# Patient Record
Sex: Female | Born: 1935 | Race: White | Hispanic: No | Smoking: Former smoker
Health system: Southern US, Community
[De-identification: ages and names within clinical notes are randomized; demographics above are authoritative.]

## PROBLEM LIST (undated history)

## (undated) DIAGNOSIS — I6529 Occlusion and stenosis of unspecified carotid artery: Secondary | ICD-10-CM

## (undated) DIAGNOSIS — I255 Ischemic cardiomyopathy: Secondary | ICD-10-CM

## (undated) DIAGNOSIS — I509 Heart failure, unspecified: Secondary | ICD-10-CM

## (undated) DIAGNOSIS — I701 Atherosclerosis of renal artery: Secondary | ICD-10-CM

## (undated) DIAGNOSIS — IMO0001 Reserved for inherently not codable concepts without codable children: Secondary | ICD-10-CM

## (undated) DIAGNOSIS — I2510 Atherosclerotic heart disease of native coronary artery without angina pectoris: Secondary | ICD-10-CM

## (undated) DIAGNOSIS — K219 Gastro-esophageal reflux disease without esophagitis: Secondary | ICD-10-CM

## (undated) DIAGNOSIS — D649 Anemia, unspecified: Secondary | ICD-10-CM

## (undated) DIAGNOSIS — I10 Essential (primary) hypertension: Secondary | ICD-10-CM

## (undated) DIAGNOSIS — I739 Peripheral vascular disease, unspecified: Secondary | ICD-10-CM

## (undated) DIAGNOSIS — M1990 Unspecified osteoarthritis, unspecified site: Secondary | ICD-10-CM

## (undated) DIAGNOSIS — E785 Hyperlipidemia, unspecified: Secondary | ICD-10-CM

## (undated) DIAGNOSIS — E7800 Pure hypercholesterolemia, unspecified: Secondary | ICD-10-CM

## (undated) DIAGNOSIS — J189 Pneumonia, unspecified organism: Secondary | ICD-10-CM

## (undated) DIAGNOSIS — E039 Hypothyroidism, unspecified: Secondary | ICD-10-CM

## (undated) DIAGNOSIS — K922 Gastrointestinal hemorrhage, unspecified: Secondary | ICD-10-CM

## (undated) DIAGNOSIS — Z5189 Encounter for other specified aftercare: Secondary | ICD-10-CM

## (undated) HISTORY — PX: CORONARY ANGIOPLASTY WITH STENT PLACEMENT: SHX49

## (undated) HISTORY — PX: BLADDER SUSPENSION: SHX72

## (undated) HISTORY — DX: Atherosclerotic heart disease of native coronary artery without angina pectoris: I25.10

## (undated) HISTORY — PX: APPENDECTOMY: SHX54

## (undated) HISTORY — DX: Hyperlipidemia, unspecified: E78.5

## (undated) HISTORY — DX: Atherosclerosis of renal artery: I70.1

## (undated) HISTORY — DX: Occlusion and stenosis of unspecified carotid artery: I65.29

## (undated) HISTORY — PX: ABDOMINAL HYSTERECTOMY: SHX81

---

## 1984-03-25 HISTORY — PX: CORONARY ANGIOPLASTY WITH STENT PLACEMENT: SHX49

## 1994-03-25 HISTORY — PX: CORONARY ARTERY BYPASS GRAFT: SHX141

## 1998-11-24 HISTORY — PX: CATARACT EXTRACTION W/ INTRAOCULAR LENS  IMPLANT, BILATERAL: SHX1307

## 2007-06-19 ENCOUNTER — Inpatient Hospital Stay (HOSPITAL_COMMUNITY): Admission: EM | Admit: 2007-06-19 | Discharge: 2007-06-24 | Payer: Self-pay | Admitting: Emergency Medicine

## 2007-06-22 HISTORY — PX: CARDIAC CATHETERIZATION: SHX172

## 2007-06-23 ENCOUNTER — Encounter (INDEPENDENT_AMBULATORY_CARE_PROVIDER_SITE_OTHER): Payer: Self-pay | Admitting: Cardiovascular Disease

## 2007-06-23 ENCOUNTER — Ambulatory Visit: Payer: Self-pay | Admitting: Vascular Surgery

## 2008-04-18 ENCOUNTER — Ambulatory Visit (HOSPITAL_COMMUNITY): Admission: RE | Admit: 2008-04-18 | Discharge: 2008-04-18 | Payer: Self-pay | Admitting: Cardiology

## 2008-04-18 HISTORY — PX: OTHER SURGICAL HISTORY: SHX169

## 2008-04-27 ENCOUNTER — Inpatient Hospital Stay (HOSPITAL_COMMUNITY): Admission: RE | Admit: 2008-04-27 | Discharge: 2008-04-28 | Payer: Self-pay | Admitting: Cardiology

## 2008-04-27 HISTORY — PX: OTHER SURGICAL HISTORY: SHX169

## 2008-05-03 ENCOUNTER — Ambulatory Visit: Payer: Self-pay | Admitting: Cardiovascular Disease

## 2009-04-25 DIAGNOSIS — J189 Pneumonia, unspecified organism: Secondary | ICD-10-CM

## 2009-04-25 HISTORY — DX: Pneumonia, unspecified organism: J18.9

## 2009-05-18 ENCOUNTER — Inpatient Hospital Stay (HOSPITAL_COMMUNITY): Admission: EM | Admit: 2009-05-18 | Discharge: 2009-05-20 | Payer: Self-pay | Admitting: Emergency Medicine

## 2009-05-19 ENCOUNTER — Encounter (INDEPENDENT_AMBULATORY_CARE_PROVIDER_SITE_OTHER): Payer: Self-pay | Admitting: Internal Medicine

## 2009-05-19 HISTORY — PX: TRANSTHORACIC ECHOCARDIOGRAM: SHX275

## 2010-04-23 ENCOUNTER — Inpatient Hospital Stay (HOSPITAL_COMMUNITY)
Admission: EM | Admit: 2010-04-23 | Discharge: 2010-04-26 | DRG: 378 | Disposition: A | Discharge status: Home / Self Care | Payer: Medicare Other | Attending: Internal Medicine | Admitting: Internal Medicine

## 2010-04-23 DIAGNOSIS — I959 Hypotension, unspecified: Secondary | ICD-10-CM | POA: Diagnosis present

## 2010-04-23 DIAGNOSIS — D62 Acute posthemorrhagic anemia: Secondary | ICD-10-CM | POA: Diagnosis present

## 2010-04-23 DIAGNOSIS — E039 Hypothyroidism, unspecified: Secondary | ICD-10-CM | POA: Diagnosis present

## 2010-04-23 DIAGNOSIS — E119 Type 2 diabetes mellitus without complications: Secondary | ICD-10-CM | POA: Diagnosis present

## 2010-04-23 DIAGNOSIS — K5731 Diverticulosis of large intestine without perforation or abscess with bleeding: Principal | ICD-10-CM | POA: Diagnosis present

## 2010-04-23 DIAGNOSIS — Z951 Presence of aortocoronary bypass graft: Secondary | ICD-10-CM

## 2010-04-23 DIAGNOSIS — I5032 Chronic diastolic (congestive) heart failure: Secondary | ICD-10-CM | POA: Diagnosis present

## 2010-04-23 DIAGNOSIS — D126 Benign neoplasm of colon, unspecified: Secondary | ICD-10-CM | POA: Diagnosis present

## 2010-04-23 DIAGNOSIS — I739 Peripheral vascular disease, unspecified: Secondary | ICD-10-CM | POA: Diagnosis present

## 2010-04-23 DIAGNOSIS — I2510 Atherosclerotic heart disease of native coronary artery without angina pectoris: Secondary | ICD-10-CM | POA: Diagnosis present

## 2010-04-23 DIAGNOSIS — R578 Other shock: Secondary | ICD-10-CM

## 2010-04-23 DIAGNOSIS — N189 Chronic kidney disease, unspecified: Secondary | ICD-10-CM | POA: Diagnosis present

## 2010-04-23 DIAGNOSIS — D696 Thrombocytopenia, unspecified: Secondary | ICD-10-CM | POA: Diagnosis present

## 2010-04-23 DIAGNOSIS — I129 Hypertensive chronic kidney disease with stage 1 through stage 4 chronic kidney disease, or unspecified chronic kidney disease: Secondary | ICD-10-CM | POA: Diagnosis present

## 2010-04-23 DIAGNOSIS — I70209 Unspecified atherosclerosis of native arteries of extremities, unspecified extremity: Secondary | ICD-10-CM | POA: Diagnosis present

## 2010-04-23 DIAGNOSIS — Z7982 Long term (current) use of aspirin: Secondary | ICD-10-CM

## 2010-04-23 DIAGNOSIS — E876 Hypokalemia: Secondary | ICD-10-CM | POA: Diagnosis present

## 2010-04-23 DIAGNOSIS — Z7902 Long term (current) use of antithrombotics/antiplatelets: Secondary | ICD-10-CM

## 2010-04-23 DIAGNOSIS — I2589 Other forms of chronic ischemic heart disease: Secondary | ICD-10-CM | POA: Diagnosis present

## 2010-04-23 LAB — CBC
HCT: 32.9 % — ABNORMAL LOW (ref 36.0–46.0)
HCT: 35.5 % — ABNORMAL LOW (ref 36.0–46.0)
Hemoglobin: 11 g/dL — ABNORMAL LOW (ref 12.0–15.0)
Hemoglobin: 11.2 g/dL — ABNORMAL LOW (ref 12.0–15.0)
Hemoglobin: 10.2 g/dL — ABNORMAL LOW (ref 12.0–15.0)
Hemoglobin: 11.8 g/dL — ABNORMAL LOW (ref 12.0–15.0)
MCH: 30.5 pg (ref 26.0–34.0)
MCHC: 34 g/dL (ref 30.0–36.0)
MCHC: 34.5 g/dL (ref 30.0–36.0)
MCHC: 33.2 g/dL (ref 30.0–36.0)
MCV: 93 fL (ref 78.0–100.0)
MCV: 91.9 fL (ref 78.0–100.0)
MCV: 91.7 fL (ref 78.0–100.0)
Platelets: 180 10*3/uL (ref 150–400)
RBC: 3.57 MIL/uL — ABNORMAL LOW (ref 3.87–5.11)
RBC: 3.58 MIL/uL — ABNORMAL LOW (ref 3.87–5.11)
RBC: 3.23 MIL/uL — ABNORMAL LOW (ref 3.87–5.11)
RDW: 14.7 % (ref 11.5–15.5)
RDW: 14.5 % (ref 11.5–15.5)

## 2010-04-23 LAB — MRSA PCR SCREENING: MRSA by PCR: NEGATIVE

## 2010-04-23 LAB — DIFFERENTIAL
Basophils Absolute: 0 10*3/uL (ref 0.0–0.1)
Basophils Relative: 0 % (ref 0–1)
Eosinophils Absolute: 0.1 10*3/uL (ref 0.0–0.7)
Lymphs Abs: 1.1 10*3/uL (ref 0.7–4.0)
Monocytes Absolute: 0.6 10*3/uL (ref 0.1–1.0)
Neutro Abs: 3.8 10*3/uL (ref 1.7–7.7)

## 2010-04-23 LAB — POCT I-STAT, CHEM 8
Calcium, Ion: 1.09 mmol/L — ABNORMAL LOW (ref 1.12–1.32)
Chloride: 107 mEq/L (ref 96–112)
Creatinine, Ser: 1.3 mg/dL — ABNORMAL HIGH (ref 0.4–1.2)
Glucose, Bld: 147 mg/dL — ABNORMAL HIGH (ref 70–99)
HCT: 33 % — ABNORMAL LOW (ref 36.0–46.0)
Hemoglobin: 11.2 g/dL — ABNORMAL LOW (ref 12.0–15.0)
TCO2: 23 mmol/L (ref 0–100)

## 2010-04-23 LAB — PROTIME-INR: Prothrombin Time: 13.4 seconds (ref 11.6–15.2)

## 2010-04-23 LAB — BRAIN NATRIURETIC PEPTIDE: Pro B Natriuretic peptide (BNP): 51 pg/mL (ref 0.0–100.0)

## 2010-04-23 LAB — LACTIC ACID, PLASMA: Lactic Acid, Venous: 2.1 mmol/L (ref 0.5–2.2)

## 2010-04-23 LAB — GLUCOSE, CAPILLARY: Glucose-Capillary: 128 mg/dL — ABNORMAL HIGH (ref 70–99)

## 2010-04-23 LAB — CARDIAC PANEL(CRET KIN+CKTOT+MB+TROPI): Relative Index: INVALID (ref 0.0–2.5)

## 2010-04-23 LAB — PHOSPHORUS: Phosphorus: 3.7 mg/dL (ref 2.3–4.6)

## 2010-04-23 LAB — BASIC METABOLIC PANEL
BUN: 25 mg/dL — ABNORMAL HIGH (ref 6–23)
Calcium: 8.3 mg/dL — ABNORMAL LOW (ref 8.4–10.5)
Creatinine, Ser: 1 mg/dL (ref 0.4–1.2)
GFR calc Af Amer: >60 mL/min (ref >60)
Glucose, Bld: 239 mg/dL — ABNORMAL HIGH (ref 70–99)
Sodium: 136 mEq/L (ref 135–145)

## 2010-04-23 LAB — OCCULT BLOOD, POC DEVICE: Fecal Occult Bld: POSITIVE

## 2010-04-24 DIAGNOSIS — I10 Essential (primary) hypertension: Secondary | ICD-10-CM

## 2010-04-24 DIAGNOSIS — K922 Gastrointestinal hemorrhage, unspecified: Secondary | ICD-10-CM

## 2010-04-24 DIAGNOSIS — D696 Thrombocytopenia, unspecified: Secondary | ICD-10-CM

## 2010-04-24 DIAGNOSIS — D62 Acute posthemorrhagic anemia: Secondary | ICD-10-CM

## 2010-04-24 LAB — GLUCOSE, CAPILLARY
Glucose-Capillary: 119 mg/dL — ABNORMAL HIGH (ref 70–99)
Glucose-Capillary: 130 mg/dL — ABNORMAL HIGH (ref 70–99)
Glucose-Capillary: 132 mg/dL — ABNORMAL HIGH (ref 70–99)
Glucose-Capillary: 146 mg/dL — ABNORMAL HIGH (ref 70–99)
Glucose-Capillary: 78 mg/dL (ref 70–99)

## 2010-04-24 LAB — BASIC METABOLIC PANEL
BUN: 16 mg/dL (ref 6–23)
Calcium: 8.4 mg/dL (ref 8.4–10.5)
Creatinine, Ser: 0.75 mg/dL (ref 0.4–1.2)
GFR calc Af Amer: >60 mL/min (ref >60)
GFR calc non Af Amer: >60 mL/min (ref >60)

## 2010-04-24 LAB — CBC
Hemoglobin: 11.3 g/dL — ABNORMAL LOW (ref 12.0–15.0)
Hemoglobin: 11.4 g/dL — ABNORMAL LOW (ref 12.0–15.0)
Hemoglobin: 12 g/dL (ref 12.0–15.0)
MCH: 30.8 pg (ref 26.0–34.0)
MCH: 30.8 pg (ref 26.0–34.0)
MCHC: 33.7 g/dL (ref 30.0–36.0)
MCV: 91.1 fL (ref 78.0–100.0)
Platelets: 122 10*3/uL — ABNORMAL LOW (ref 150–400)
Platelets: 125 10*3/uL — ABNORMAL LOW (ref 150–400)
Platelets: 133 10*3/uL — ABNORMAL LOW (ref 150–400)
RBC: 3.7 MIL/uL — ABNORMAL LOW (ref 3.87–5.11)
RBC: 3.7 MIL/uL — ABNORMAL LOW (ref 3.87–5.11)
WBC: 4.7 10*3/uL (ref 4.0–10.5)
WBC: 4.8 10*3/uL (ref 4.0–10.5)

## 2010-04-24 LAB — CARDIAC PANEL(CRET KIN+CKTOT+MB+TROPI)
CK, MB: 0.9 ng/mL (ref 0.3–4.0)
Relative Index: INVALID (ref 0.0–2.5)
Total CK: 58 U/L (ref 7–177)
Troponin I: 0.01 ng/mL (ref 0.00–0.06)
Troponin I: 0.02 ng/mL (ref 0.00–0.06)

## 2010-04-25 ENCOUNTER — Other Ambulatory Visit: Payer: Self-pay | Admitting: Gastroenterology

## 2010-04-25 LAB — BASIC METABOLIC PANEL
Chloride: 108 mEq/L (ref 96–112)
Creatinine, Ser: 0.63 mg/dL (ref 0.4–1.2)
GFR calc Af Amer: >60 mL/min (ref >60)
Potassium: 3.3 mEq/L — ABNORMAL LOW (ref 3.5–5.1)
Sodium: 139 mEq/L (ref 135–145)

## 2010-04-25 LAB — GLUCOSE, CAPILLARY
Glucose-Capillary: 157 mg/dL — ABNORMAL HIGH (ref 70–99)
Glucose-Capillary: 183 mg/dL — ABNORMAL HIGH (ref 70–99)

## 2010-04-25 LAB — CBC
Hemoglobin: 13.7 g/dL (ref 12.0–15.0)
MCH: 31 pg (ref 26.0–34.0)
Platelets: 148 10*3/uL — ABNORMAL LOW (ref 150–400)
RBC: 4.42 MIL/uL (ref 3.87–5.11)
WBC: 5.9 10*3/uL (ref 4.0–10.5)

## 2010-04-26 DIAGNOSIS — K5731 Diverticulosis of large intestine without perforation or abscess with bleeding: Secondary | ICD-10-CM

## 2010-04-26 LAB — BASIC METABOLIC PANEL
BUN: 13 mg/dL (ref 6–23)
GFR calc Af Amer: >60 mL/min (ref >60)
GFR calc non Af Amer: >60 mL/min (ref >60)
Potassium: 4.1 mEq/L (ref 3.5–5.1)

## 2010-04-26 LAB — CBC
HCT: 33.6 % — ABNORMAL LOW (ref 36.0–46.0)
Platelets: 131 10*3/uL — ABNORMAL LOW (ref 150–400)
RBC: 3.7 MIL/uL — ABNORMAL LOW (ref 3.87–5.11)
RDW: 14.8 % (ref 11.5–15.5)
WBC: 5 10*3/uL (ref 4.0–10.5)

## 2010-04-27 LAB — TYPE AND SCREEN
Unit division: 0
Unit division: 0

## 2010-04-30 NOTE — Discharge Summary (Addendum)
NAMESIMISOLA, SANDLES                 ACCOUNT NO.:  [id number]  MEDICAL RECORD NO.:  [id number]           PATIENT TYPE:  I  LOCATION:  5530                         FACILITY:  [facility]  PHYSICIAN:  Lonia Blood, M.D.       DATE OF BIRTH:  Nov 21, 1922  DATE OF ADMISSION:  04/23/2010 DATE OF DISCHARGE:  04/26/2010                              DISCHARGE SUMMARY   PRIMARY CARE PROVIDER:  Broadus John T. Pamalee Leyden, MD from [facility].  DISCHARGE DIAGNOSES: 1. Acute blood loss anemia. 2. Gastrointestinal bleed secondary to diverticula. 3. Peripheral vascular disease. 4. Hypertension.  DISCHARGE MEDICATIONS: 1. Allopurinol 300 mg p.o. daily. 2. Amlodipine 10 mg half a tab p.o. b.i.d. 3. Aspirin 81 mg p.o. daily. 4. Calcium carbonate/vitamin D over-the-counter 1 tablet p.o. daily. 5. Coreg CR 20 mg p.o. b.i.d. 6. Crestor 20 mg p.o. daily at bedtime. 7. Lasix 40 mg p.o. daily. 8. Hydralazine 50 mg p.o. q.i.d. 9. Isosorbide mononitrate XR 60 mg p.o. b.i.d. 10.Omeprazole 20 mg p.o. daily. 11.Plavix 75 mg p.o. daily. 12.Ranexa 100 mg 1 tab p.o. daily. 13.Synthroid 88 mcg p.o. daily. 14.Sertraline 50 mg p.o. daily.  PROCEDURES: 1. Nuclear medicine gastrointestinal bleeding study yielding probable     diverticular bleed. 2. Chest x-ray on April 23, 2010, yields left neck central venous     catheter with more medial course in the neck were normal.  No acute     complicating features.  Stable mild cardiomegaly post CABG.     COPD/emphysema.  No acute cardiopulmonary disease. 3. Colonoscopy on April 25, 2010, yields 2 polyps found in the mid     transverse colon.  Moderate diverticulosis found in the sigmoid to     the descending colon segments.  CONSULTS:  Gastroenterology, Dr. Arlyce Dice.  LABORATORY DATA:  Pertinent labs on admission, FOBT positive. Hemoglobin 11.2, hematocrit 33.0, glucose 147, BUN 36, and creatinine 1.3.  PT 13.4 and INR 1.0.  MRSA screening negative.   Total CK 61, CK-MB 1.1, troponin 0.01, magnesium 1.8, and phosphorus 3.7.  BNP 51.  Lactic acid 2.1.  CBC on discharge yields a hemoglobin of 11.2, hematocrit 33.6, and platelets 131.  Sodium 140, potassium 4.1, chloride 110, CO2 of 23, BUN 13, creatinine 0.86, and glucose 128.  BRIEF SUMMARY:  Ms. Hadaway is a delightful 77 year old female with a history of CAD, PVD, CHF, who presented to [facility] ER with acute GI bleed. The patient is on Plavix and aspirin.  She states that she awakened at 4 a.m. with a sense of urgency for bowel movement.  States she passed a large amount of stool with bright red blood per rectum and clots.  Her family brought her to the emergency room as she lives with her daughter. Upon arrival to the emergency room, she was hypotensive with systolic blood pressure in the 90s and volume depleted.  She was admitted to the ICU for further evaluation and treatment.  Her hemoglobin at that time was 11.2.  HOSPITAL COURSE: 1. Acute blood loss anemia, currently resolved.  Hemoglobin was 11.2     on admission.  The  patient responded to IV fluids.  Received 2     units of packed red blood cells on admission.  Hemoglobin on     discharge 11.2. 2. GI bleed secondary to diverticular bleed.  Currently, resolved. The patient has been seen in consultation by Dr. Arlyce Dice from gastroenterology He considered that it is safe to resume aspirina nd plavix for the patient's severe PVD 3. PVD.  We will resume Plavix and aspirin.  We will discontinue     cilostazol. 4. Hypertension, stable.  Have resumed home antihypertensives.  PHYSICAL EXAMINATION:  Documented in rounding note dated April 26, 2010.  FOLLOWUP:  The patient is to see Dr. Lynnea Ferrier, at [facility] in 7-10 days.  CONDITION ON DISCHARGE:  Stable.  TIME SPENT:  40 minutes.     Gwenyth Bender, NP   ______________________________ Lonia Blood, M.D.    KMB/MEDQ  D:  04/26/2010  T:  04/26/2010   Job:  [id number]  cc:   Broadus John T. Pamalee Leyden, MD  Electronically Signed by Lonia Blood M.D. on 04/30/2010 07:42:47 AM Electronically Signed by Toya Smothers  on 05/20/2010 08:27:46 AM

## 2010-05-02 NOTE — Procedures (Signed)
Summary: Colonoscopy  Patient: Alison Buckley Note: All result statuses are Final unless otherwise noted.  Tests: (1) Colonoscopy (COL)   COL Colonoscopy           DONE (C)     [facility]     [address]     [place], [place]  [address]           COLONOSCOPY PROCEDURE REPORT           PATIENT:  Alison Buckley, Alison Buckley  MR#:  [id number]     BIRTHDATE:  07-23-1922, 88 yrs. old  GENDER:  female     ENDOSCOPIST:  Barbette Hair. Arlyce Dice, MD     REF. BY:     PROCEDURE DATE:  04/25/2010     PROCEDURE:      Colonoscopy and polypectomy     ASA CLASS:  Class II     INDICATIONS:  hematochezia     MEDICATIONS:   Fentanyl 40 mcg IV, Versed 4 mg           DESCRIPTION OF PROCEDURE:   After the risks benefits and     alternatives of the procedure were thoroughly explained, informed     consent was obtained.  No rectal exam performed. The Pentax     Colonoscope [id number] endoscope was introduced through the anus and     advanced to the cecum, which was identified by both the appendix     and ileocecal valve, without limitations.  The quality of the prep     was excellent, using MoviPrep.  The instrument was then slowly     withdrawn as the colon was fully examined.     <<PROCEDUREIMAGES>>           FINDINGS:  Two polyps were found in the mid transverse colon (see     image008 and image009). 2 5-56mm nonbleeding sessile polyps -     removed with cold polypectomy snare and submitted to pathology     Moderate diverticulosis was found in the sigmoid to descending     colon segments (see image009 and image008).  This was otherwise a     normal examination of the colon (see image001, image002, image003,     and image010).   Retroflexed views in the rectum revealed no     abnormalities.    The scope was then withdrawn from the patient     and the procedure completed.           COMPLICATIONS:  None     ENDOSCOPIC IMPRESSION:     1) Two polyps in the mid transverse colon     2) Moderate  diverticulosis in the sigmoid to descending colon     segments     3) Otherwise normal examination           Hematochezia secondary to diverticular bleed           RECOMMENDATIONS:     1) Return to the care of your primary provider. GI follow up as     needed     REPEAT EXAM:  No           ______________________________     Barbette Hair. Arlyce Dice, MD           CC:  Benjiman Core, MD           n.     REVISED:  04/25/2010 04:56 PM     eSIGNED:   Barbette Hair. Kaplan at 04/25/2010  04:56 PM           Karrigan, [place], [id number]  Note: An exclamation mark (!) indicates a result that was not dispersed into the flowsheet. Document Creation Date: 04/25/2010 4:56 PM _______________________________________________________________________  (1) Order result status: Final Collection or observation date-time: 04/25/2010 16:06 Requested date-time:  Receipt date-time:  Reported date-time:  Referring Physician:   Ordering Physician: Melvia Heaps [phone]) Specimen Source:  Source: Launa Grill Order Number: [id number] Lab site:

## 2010-06-13 LAB — DIFFERENTIAL
Lymphocytes Relative: 12 % (ref 12–46)
Lymphs Abs: 0.8 10*3/uL (ref 0.7–4.0)
Monocytes Relative: 10 % (ref 3–12)
Neutro Abs: 5.5 10*3/uL (ref 1.7–7.7)
Neutrophils Relative %: 78 % — ABNORMAL HIGH (ref 43–77)

## 2010-06-13 LAB — CBC
HCT: 30.4 % — ABNORMAL LOW (ref 36.0–46.0)
HCT: 33.2 % — ABNORMAL LOW (ref 36.0–46.0)
Hemoglobin: 11.4 g/dL — ABNORMAL LOW (ref 12.0–15.0)
MCHC: 34.2 g/dL (ref 30.0–36.0)
MCV: 93.7 fL (ref 78.0–100.0)
MCV: 94.6 fL (ref 78.0–100.0)
MCV: 94.7 fL (ref 78.0–100.0)
Platelets: 139 10*3/uL — ABNORMAL LOW (ref 150–400)
Platelets: 156 10*3/uL (ref 150–400)
RBC: 3.51 MIL/uL — ABNORMAL LOW (ref 3.87–5.11)
RDW: 14.6 % (ref 11.5–15.5)
RDW: 14.6 % (ref 11.5–15.5)
WBC: 7 10*3/uL (ref 4.0–10.5)

## 2010-06-13 LAB — GLUCOSE, CAPILLARY
Glucose-Capillary: 137 mg/dL — ABNORMAL HIGH (ref 70–99)
Glucose-Capillary: 179 mg/dL — ABNORMAL HIGH (ref 70–99)
Glucose-Capillary: 186 mg/dL — ABNORMAL HIGH (ref 70–99)
Glucose-Capillary: 198 mg/dL — ABNORMAL HIGH (ref 70–99)

## 2010-06-13 LAB — BASIC METABOLIC PANEL
BUN: 40 mg/dL — ABNORMAL HIGH (ref 6–23)
CO2: 26 mEq/L (ref 19–32)
Calcium: 8.3 mg/dL — ABNORMAL LOW (ref 8.4–10.5)
Calcium: 8.7 mg/dL (ref 8.4–10.5)
Chloride: 102 mEq/L (ref 96–112)
Chloride: 105 mEq/L (ref 96–112)
Creatinine, Ser: 1.34 mg/dL — ABNORMAL HIGH (ref 0.4–1.2)
GFR calc Af Amer: 49 mL/min — ABNORMAL LOW (ref >60)
GFR calc non Af Amer: 40 mL/min — ABNORMAL LOW (ref >60)
Glucose, Bld: 126 mg/dL — ABNORMAL HIGH (ref 70–99)
Glucose, Bld: 75 mg/dL (ref 70–99)
Potassium: 3.6 mEq/L (ref 3.5–5.1)
Sodium: 136 mEq/L (ref 135–145)

## 2010-06-13 LAB — LIPID PANEL
Cholesterol: 111 mg/dL (ref 0–200)
LDL Cholesterol: 46 mg/dL (ref 0–99)

## 2010-06-13 LAB — TROPONIN I
Troponin I: 0.01 ng/mL (ref 0.00–0.06)
Troponin I: 0.02 ng/mL (ref 0.00–0.06)

## 2010-06-13 LAB — APTT: aPTT: 30 seconds (ref 24–37)

## 2010-06-13 LAB — PROTIME-INR: INR: 1.05 (ref 0.00–1.49)

## 2010-06-28 HISTORY — PX: CARDIOVASCULAR STRESS TEST: SHX262

## 2010-07-09 LAB — BASIC METABOLIC PANEL
Calcium: 8.8 mg/dL (ref 8.4–10.5)
Creatinine, Ser: 1.08 mg/dL (ref 0.4–1.2)
GFR calc Af Amer: 58 mL/min — ABNORMAL LOW (ref >60)

## 2010-07-09 LAB — GLUCOSE, CAPILLARY
Glucose-Capillary: 106 mg/dL — ABNORMAL HIGH (ref 70–99)
Glucose-Capillary: 67 mg/dL — ABNORMAL LOW (ref 70–99)

## 2010-07-10 LAB — CBC
HCT: 25.5 % — ABNORMAL LOW (ref 36.0–46.0)
MCHC: 34.7 g/dL (ref 30.0–36.0)
MCV: 93.6 fL (ref 78.0–100.0)
Platelets: 125 10*3/uL — ABNORMAL LOW (ref 150–400)
RDW: 16.1 % — ABNORMAL HIGH (ref 11.5–15.5)

## 2010-07-10 LAB — IRON AND TIBC
Saturation Ratios: 15 % — ABNORMAL LOW (ref 20–55)
TIBC: 191 ug/dL — ABNORMAL LOW (ref 250–470)
UIBC: 163 ug/dL

## 2010-07-10 LAB — FERRITIN: Ferritin: 570 ng/mL — ABNORMAL HIGH (ref 10–291)

## 2010-07-10 LAB — FOLATE: Folate: 11.5 ng/mL

## 2010-07-10 LAB — BASIC METABOLIC PANEL
BUN: 26 mg/dL — ABNORMAL HIGH (ref 6–23)
BUN: 28 mg/dL — ABNORMAL HIGH (ref 6–23)
CO2: 22 mEq/L (ref 19–32)
Calcium: 9.1 mg/dL (ref 8.4–10.5)
GFR calc non Af Amer: 41 mL/min — ABNORMAL LOW (ref >60)
GFR calc non Af Amer: 42 mL/min — ABNORMAL LOW (ref >60)
Glucose, Bld: 62 mg/dL — ABNORMAL LOW (ref 70–99)
Glucose, Bld: 67 mg/dL — ABNORMAL LOW (ref 70–99)
Potassium: 4.8 mEq/L (ref 3.5–5.1)
Potassium: 5.4 mEq/L — ABNORMAL HIGH (ref 3.5–5.1)

## 2010-07-10 LAB — GLUCOSE, CAPILLARY
Glucose-Capillary: 109 mg/dL — ABNORMAL HIGH (ref 70–99)
Glucose-Capillary: 217 mg/dL — ABNORMAL HIGH (ref 70–99)
Glucose-Capillary: 67 mg/dL — ABNORMAL LOW (ref 70–99)
Glucose-Capillary: 96 mg/dL (ref 70–99)

## 2010-08-07 NOTE — Cardiovascular Report (Signed)
NAME:  Alison Buckley, Alison Buckley NO.:  [id number]   MEDICAL RECORD NO.:  [id number]          PATIENT TYPE:  INP   LOCATION:  3710                         FACILITY:  [facility]   PHYSICIAN:  Nicki Guadalajara, M.D.     DATE OF BIRTH:  July 04, 1922   DATE OF PROCEDURE:  DATE OF DISCHARGE:                            CARDIAC CATHETERIZATION   INDICATIONS:  [name] is a very pleasant 77 year old female who  resides both in [place] as well as in [place] area in  different times in the year, living with her sons.  She has established  coronary artery disease, underwent CABG revascularization surgery with a  LIMA to the LAD, a saphenous vein to the obtuse marginal vessel and a  saphenous vein to the right coronary artery.  She subsequently has  undergone stenting, I believe 1 year later.  She had been doing  relatively well.  She was admitted to [facility] on June 19, 2007, with 2-day history of recurrent chest pain.  She was admitted by  Dr. Mariah Milling.  ECG showed normal sinus rhythm with right bundle branch  block and associated repolarization changes.  She had negative enzymes.  However, her chest pain was comparable to previous cardiac pain.  She  had minimal elevation of a D-dimer with a low-probability V/Q scan for  pulmonary emboli.  Definitive cardiac catheterization was recommended.   PROCEDURE:  After premedication with Valium 4 mg intravenously, the  patient was prepped and draped in the usual fashion.  Her right femoral  artery was punctured anteriorly and a 5-French sheath was inserted.  Diagnostic cardiac catheterization in the native coronary arteries was  done with 5-French FL-4 left and right coronary catheters.  A right  bypass catheter was necessary for selective engagement in the vein graft  supplying the right coronary artery.  A left bypass catheter was  necessary for selective engagement into the saphenous vein graft  supplying the  circumflex marginal vessel.  The grafts were not marked.  Ultimately a LIMA catheter was necessary for selective engagement in a  small left internal mammary artery.  A 5-French pigtail catheter was  used for biplane cine left ventriculography.  Distal aortography was  also performed in light of her hypertensive history, coronary  obstructive disease, to evaluate and make certain she did not have any  renal artery stenosis or abdominal aortic aneurysm.   Hemostasis was obtained by direct manual pressure.  The patient  tolerated the procedure well.   HEMODYNAMIC DATA:  Central aortic pressure is 170/53.  Left ventricular  pressure is 170/21.   ANGIOGRAPHIC DATA:  The left main coronary was calcified and immediately  bifurcated into an LAD and left circumflex system.  There appeared to be  visualization of a stent at the ostium of the LAD.  There was diffuse  narrowing of approximately 40-50% in this ostial LAD stented segment.  The LAD was totally occluded after the second proximal diagonal vessel.  There was narrowing of 50-70% in this small diagonal vessel.   The circumflex vessel was totally  occluded at its origin.   The right coronary artery was totally occluded at its origin and was  densely calcified at the ostium.   The vein graft supplying the PDA of the right coronary artery was widely  patent and free of disease.  This anastomosed into the mid PDA.  The RCA  filled up to its proximal occlusion antegrade and had 95% stenosis  before the PDA takeoff.  After the PDA takeoff, the RCA gave rise to a  posterolateral system.  Septal collaterals were present from the PDA,  which seemed to supply septals and the midportion of the LAD with faint  visualization of a diagonal branch.   The vein graft supplying the marginal vessel was widely patent.  This  supplied a moderate-sized __________marginal vessel to the distal LAD.   The left subclavian artery had 30-40% narrowing.  The  LIMA was small-  caliber and ultimately was atretic and there was not any significant  visualization of the native LAD system from the LIMA graft.   Biplane cine left ventriculography revealed low normal to mild LV  dysfunction with an ejection fraction in the 50% to 55% range.  On the  RAO projection there was faint mild hypocontractility in the mid  anterolateral wall with trace angiographic mitral regurgitation.  On the  LAO projection there was mild hypocontractility in the inferolateral  segment.   Distal aortography revealed mild irregularity of the infrarenal aorta  without significant stenosis.  There was 30% right renal artery  narrowing.   IMPRESSION:  1. Low normal left ventricular contractility with mild      hypocontractility involving the mid anterolateral wall and      inferolateral segment with trace angiographic mitral regurgitation.  2. Severe native coronary obstructive disease with calcification of      the coronary arteries, evidence for a previously-placed stent in      the diagonal vessel  3. Total occlusion of the proximal circumflex near the ostium.  4. Total occlusion of the native right coronary artery.  5. Widely patent vein graft supplying the mid posterior descending      artery with evidence for collateralization via septal collaterals      to the mid-left anterior descending artery.  6. Widely patent saphenous vein graft supplying the obtuse marginal      vessel with normal distal circumflex marginal vessel and faint      collaterals supplying the distal left anterior descending artery      segment via the marginal vessels.  7. Atretic left internal mammary artery, which previously supplied the      left anterior descending artery.  8. Diffuse irregularity of the infrarenal aorta without significant      aneurysm or stenosis and with 30% narrowing in the right renal      artery.   RECOMMENDATIONS:  Medical therapy.  The patient may be a candidate  also  for the addition of Ranexa and possibly ECP treatment adjunctive to  increased medical therapy.           ______________________________  Nicki Guadalajara, M.D.     TK/MEDQ  D:  06/22/2007  T:  06/22/2007  Job:  [id number]   cc:   [facility]  Dr. Higinio Roger, MD

## 2010-08-07 NOTE — Cardiovascular Report (Signed)
NAMEINDONESIA, MCKEOUGH                 ACCOUNT NO.:  [id number]   MEDICAL RECORD NO.:  [id number]          PATIENT TYPE:  AMB   LOCATION:  SDS                          FACILITY:  [facility]   PHYSICIAN:  Vonna Kotyk R. Jacinto Halim, MD       DATE OF BIRTH:  [birth year]   DATE OF PROCEDURE:  04/18/2008  DATE OF DISCHARGE:                            CARDIAC CATHETERIZATION   PROCEDURES PERFORMED:  1. Abdominal aortogram.  2. Selective left renal arteriography.  3. Crossover into the right leg and right femoral arteriogram with      distal runoff.  4. Left femoral arteriogram with distal runoff.   INDICATIONS:  Alison Buckley is a fairly active 77 year old female who  has been complaining of Fontaine class III claudication even walking  minimal distance.  She has now started to use walking cane.  She has  difficulty in her gait because of severe pain and weakness in her both  legs.  She gives that pain was minimally more severe in the right than  the left.  Given this, she had undergone peripheral Doppler evaluation,  which had markedly abnormal.  Hence, she was brought to the  catheterization lab to evaluate her peripheral anatomy.  Abdominal  aortogram was performed because of her abdominal sclerosis and to  exclude renal artery stenosis given the fact that she had developed  renal insufficiency with ACE inhibitor therapy for her hypertension.   Abdominal aortogram.  Abdominal aortogram revealed presence of two renal  arteries, one on either side.  There was a high-grade 80-90% stenosis of  the left and an 80% stenosis on the right.  There was mild-to-moderate  amount of arthrosclerotic changes noted in the abdominal aorta.  Aortoiliac bifurcation was widely patent.  The iliac arteries were  widely patent.    Right femoral arteriogram with distal runoff.  Right femoral arteriogram  with distal runoff revealed right superficial femoral artery to have a  tandem long segment 80-99% stenosis throughout the  adductor canal.   Below the right knee, there was two-vessel runoff in the form of  anterior tibial and peroneal arteries.  The posterior tibial artery was  occluded in its mid segment.   Left femoral arteriogram with distal runoff.  Left femoral arteriogram  with distal runoff revealed again high-grade 80-90% stenosis in the left  superficial femoral artery throughout the entire adductor canal.   The proximal popliteal artery showed a 90% stenosis.   Below the left knee, there was two-vessel runoff similar to the one on  the right leg and the posterior tibial artery is occluded.  The anterior  tibial artery has an 80% focal stenosis, but however, there is two-  vessel runoff noted below the left knee.   RECOMMENDATION:  Because of presence of bilateral renal artery stenoses  which I did not initially anticipate, but was now formed during  angiography.  Chronic renal insufficiency that she will need renal  angioplasty first for renal preservation and to hopefully reduce the  risk of contrast nephropathy.  I suspect increasing serum creatinine  secondary to ACE inhibitor  use for hypertension is suggestive of  bilateral renal artery stenosis.  The contrast access was used in an  elderly female, I  had postpone the procedure to bring her back  electively for bilateral renal angioplasty followed by one of the legs  to be revascularized.  She will probably need another repeat angiography  to fixed the contralateral leg.  I will discuss these issues with the  patient and also the family.   A total of 90 mL of contrast was utilized for diagnostic angiography.   DETAILS OF PROCEDURE:  Under usual sterile precautions using a 5-French  left femoral arterial access, a 5-French Omniflush catheter was utilized  to perform abdominal aortogram.  The same catheter was utilized to  crossover the right femoral arteriogram with distal runoff was  performed.  Then, a 5-French right coronary catheter  was utilized, a  short right coronary contrast was utilized to engage the left renal  artery.  Having given heparin, we are going to make a copy of that  picture, stabilizer guidewire into the left renal artery, and I tried to  cross the end-hole catheter to measure the gradient.  In spite of  significant wire support, I was unable to pass even a 4-French catheter.  I was felt that this was probably related to high-grade nature of the  renal artery stenosis.  Hence, I  abandoned the procedure of hemodynamic  monitoring and I have decided to proceed with renal angioplasty at a  later date.  Then, the attention was directed towards the left lower  extremity and left lower extremity arteriogram with distal runoff was  performed through the arterial access sheath.  The patient tolerated the  procedure well.  No immediate complication.      Alison Hilts. Jacinto Halim, MD  Electronically Signed     JRG/MEDQ  D:  04/18/2008  T:  04/18/2008  Job:  [id number]   cc:   Antonieta Iba, MD

## 2010-08-07 NOTE — Cardiovascular Report (Signed)
Alison Buckley, Alison Buckley                 ACCOUNT NO.:  [id number]   MEDICAL RECORD NO.:  [id number]          PATIENT TYPE:  INP   LOCATION:  2031                         FACILITY:  [facility]   PHYSICIAN:  Cristy Hilts. Jacinto Halim, MD       DATE OF BIRTH:  1922/09/08   DATE OF PROCEDURE:  04/27/2008  DATE OF DISCHARGE:                            CARDIAC CATHETERIZATION   PROCEDURES PERFORMED:  1. Selective left renal arteriography.  2. Percutaneous transluminal angioplasty and stenting of the left      renal artery.  3. Percutaneous transluminal angioplasty and stenting of the left      superficial femoral artery.   INDICATION:  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  She had undergone peripheral angiography on April 18, 2008, and  this had revealed an 90% stenosis of the left renal artery and possibly  80% stenosis of the right renal artery.  She was also found to have long  segment stenosis of bilateral superficial femoral artery and left  proximal popliteal artery.  She had 1-vessel runoff in both the lower  extremities.  Now, she is brought in an elective fashion for an  angioplasty of the renal arteries and also one of the superficial  femoral arteries.   INTERVENTION DATA:  Successful PTA and stenting of the left renal artery  with implantation of a 5.5 x 15 mm Herculink stent, which was deployed  at 15 atmospheric pressure.  Overall, the stenosis was reduced from 90%  to 0% with brisk flow noted in the renal artery.   Selective right renal arteriography revealed no significant stenosis or  pressure gradient across the right renal artery, hence this was left  alone.   Left lower extremity angioplasty results:  Successful PTA and stenting  of the left proximal popliteal artery with implantation of a 6.0 x 18 mm  EverFlex  self-expanding stent.  The superficial femoral artery  throughout the adductor canal and just outside of the adductor canal  was stented with a 6.0 x 150 mm LifeStent self-expanding stent.  Overall, the stenosis was reduced from 89% to 0%, brisk flow noted.   A total of 100 mL of contrast was utilized for both renal angiography,  angioplasty, and also left superficial femoral artery angioplasty.   TECHNIQUE OF THE PROCEDURE:  The procedure itself was very challenging.  The renal angioplasty was fairly simple, straightforward.  Right femoral  arterial access was obtained using a 7-French sheath, and a 7-French  short JR-4 guide was utilized to engage the left renal artery and using  a 0.01/8th of an inch stabilizer wire, I was able to angioplasty the  renal artery using a 5.0 x 15 mm via track balloon at around 10  atmospheric pressure followed by stenting with a 5.5 x 15 mm Herculink  stent at 15 atmospheric pressure with overall reduction of stenosis from  90% to 0%.  The right renal artery was then selectively cannulated and  angiography  was performed.  The catheter was then pulled out of the body  over the Versacore wire.   Using a 5-French crossover catheter, I was able to cross into the left  femoral artery and using the same Versacore wire, I was able to place a  Terumo sheath into the left femoral artery.  With great amount of  difficulty, using multiple guide wires including the 0.01/4th of an inch  Stabilizer wire and also Siri Cole, which is a 0.09th of an inch wire  tip was utilized.  With great difficulty, I was able to cross the high-  grade lesions of the left superficial femoral artery.  Having performed  this, I was able to angioplasty the whole length of the left superficial  femoral artery using a Sleek 4.0 x 120 mm balloon with multiple  inflations around 8 atmospheric pressure.  Having performed this, I was  able to exchange the wire with use of a 5-French end-hole  catheter  through a Versacore wire.  I performed angioplasty and stenting of the  left superficial femoral artery with a 6.0 x 150 mm LifeStent in the  adductor canal.  However, I realized that the femoral-popliteal artery  had high-grade lesion.  Hence this was angioplastied with a 5.0 x 40 mm  FoxCross balloon followed by stenting with a 6.0 x 18 mm EverFlex self-  expanding stent.  It was overlapped with a previously placed stent.  Having performed this, this was postdilated with 5.0 x 120 mm FoxCross  balloon at around 5-8 atmospheric pressures throughout the stented  segment and having performed this, angiography revealed excellent  result.  Intra-arterial 200 mcg of nitroglycerin was also administered.  Excellent results were noted.  The sheath was then pulled back into the  right femoral artery and exchanged to a short sheath.  The patient  tolerated the procedure well.  No immediate complication noted.  The ACT  was maintained greater than 200.      Cristy Hilts. Jacinto Halim, MD  Electronically Signed     JRG/MEDQ  D:  04/27/2008  T:  04/28/2008  Job:  [id number]   cc:   Antonieta Iba, MD  Priscille Heidelberg. Pamalee Leyden, MD

## 2010-08-10 NOTE — Discharge Summary (Signed)
NAMENOVICE, VRBA                 ACCOUNT NO.:  [id number]   MEDICAL RECORD NO.:  [id number]          PATIENT TYPE:  INP   LOCATION:  3710                         FACILITY:  [facility]   PHYSICIAN:  Lezlie Octave, N.P.     DATE OF BIRTH:  03-27-1922   DATE OF ADMISSION:  06/19/2007  DATE OF DISCHARGE:  06/24/2007                               DISCHARGE SUMMARY   HISTORY OF PRESENT ILLNESS:  Alison Buckley is an 77 year old female patient  with known coronary artery disease.  She underwent bypass grafting x3 in  1996 in [place], [place].  She had then had a PCI and stenting, 1  year later.  She also has known ischemic cardiomyopathy.  She apparently  is down here visiting her family and has had some chest pain.  Thus, she  came to the emergency room.  She was admitted.  She was put on IV  heparin, IV nitroglycerin, and was decided that she may need to undergo  cardiac catheterization.  We requested records from her previous  doctors.  It appeared that she had rotoblade and stent to her proximal  LAD in the left main.  She had NIR Primo stent placed.  She had a  totaled LIMA to her LAD at that cath for surgery.  In 1997, she  underwent cardiac catheterization, was performed by Dr. Nicki Guadalajara,  please see his dictated note for complete details.  Her SVG to RCA was  patent and her SVG to her circumflex was patent, LIMA to her LAD was  atretic.  Her EF was 50%-55%.  She has a 30% right renal artery  stenosis.  It was decided to treat her medically.  There was no high-  grade blockages to intervene on.  Postprocedure, she did have some blood  loss anemia and she was given 2 units of packed red blood cells.  It was  decided that we would put her on Ranexa for angina.  On June 24, 2007,  she was seen by Dr. Julien Nordmann, and considered stable for discharge  home.   LABORATORY DATA:  Her labs revealed a low hemoglobin at 7.8 on June 23, 2007.  On admission, her hemoglobin was 10.1, hematocrit  28.  On the day  of discharge, June 24, 2007, her hemoglobin was 10.8 and hematocrit was  31.9, and platelets were 99.  She had negative occult blood in her  stool.  Discharge sodium 139, potassium 4.7, BUN 40, creatinine 1.52 and  chloride 108, CO2 24, total protein was 6, albumin was 3.5, magnesium  was 2.2, and hemoglobin A1c was 6.4.  CK-MB and troponins were all  negative and her BNP was 97, total cholesterol is 103, triglycerides  175, HDL was 22 and LDL was 46.  TSH was 0.326.  She had a Doppler done  of her groin.  She had no pseudoaneurysm or AV fistula.  I did not see a  chest x-ray on the chart, at the time of this dictation.   DISCHARGE MEDICATIONS:  1. Crestor 10 mg a day.  2. Coreg CR 20  mg a day.  3. TriCor 145 mg at bedtime.  4. Omeprazole 20 mg every day.  5. Glyburide 2.5 mg every day.  6. Sertraline  25 mg a day.  7. Lisinopril 40 mg day.  8. L-thyroxine 88 mcg a day.  9. Allopurinol 300 mg a day.  10.Norvasc 10 mg a day.  11.Lasix every other day 20 mg.  12.Cozaar 100 mg a day.  13.Ranexa 500 mg twice a day.  14.Nitroglycerin patch.  15.Plavix 75 mg a day.  16.Nitroglycerin p.r.n. as needed for chest pain.   DISCHARGE DIAGNOSES:  1. Unstable angina.  2. Status post cath without progressed disease at this time.  She does      have right to left and left to left collaterals from her circumflex      and right carotid artery to her occluded left internal mammary      artery to her left anterior descending.  Placed on Ranexa for      angina.  3. Previous ischemic cardiomyopathy, EF with cath was 50%-55%.  4. Blood loss anemia given 2 units of packed red blood cells during      this admission of post cath.  5. Non-insulin-dependent diabetes mellitus.  6. Renal insufficiency.  7. Elevated D-dimer with a VQ low probability for pulmonary embolism,      during this hospitalization.  8. Hypothyroidism.  9. History of gout.      Lezlie Octave, N.P.      BB/MEDQ  D:  07/31/2007  T:  08/01/2007  Job:  [id number]

## 2010-08-10 NOTE — Discharge Summary (Signed)
Alison Buckley, UMPHLETT                 ACCOUNT NO.:  [id number]   MEDICAL RECORD NO.:  [id number]          PATIENT TYPE:  INP   LOCATION:  2031                         FACILITY:  [facility]   PHYSICIAN:  Cristy Hilts. Jacinto Halim, MD       DATE OF BIRTH:  Jan 08, 1923   DATE OF ADMISSION:  04/27/2008  DATE OF DISCHARGE:  04/28/2008                               DISCHARGE SUMMARY   DISCHARGE DIAGNOSES:  1. Claudication.  2. Bilateral renal artery stenosis.      a.     Percutaneous transluminal coronary angioplasty and stent       deployment to the left renal artery successfully.      b.     Peripheral vascular disease with abnormal ABIs.  3. Significant stenosis of the left mid and distal superficial femoral      artery.      a.     Percutaneous transluminal angioplasty and stent deployment       of the left superficial femoral artery.  4. Chronic renal insufficiency.  5. Acute anemia from previous procedure on April 11, 2008.  6. Thrombocytopenia, new from previous procedure in January 2010.  7. Coronary disease with history of bypass grafting.  8. Hyperkalemia, resolved.  9. Non-insulin-dependent diabetes mellitus, stable.   DISCHARGE MEDICATIONS:  Please note the patient's meds were somewhat  different than previously thought she has an improved list with her  medicines.  1. Lasix 20 mg daily.  2. Amlodipine 10 mg daily.  3. Crestor 10 mg daily.  4. Coreg CR 20 mg daily.  5. Ranexa 500 mg twice a day.  6. Omeprazole 20 mg daily.  7. Sertraline 50 mg daily.  8. Allopurinol 300 mg daily.  9. Glyburide 2.5 mg daily.  10.Plavix 75 mg daily.  11.Synthroid 88 mcg daily.  12.Aspirin 81 mg daily.  We decreased that dose.  13.We started Nu-Iron 150 mg twice a day.  14.We resumed her lisinopril instead of 40 at 20 mg daily.  15.Nitroglycerin 150 is under tongue as needed for chest pain as      before.  16.The patient's documentation had nitroglycerin patch and TriCor, but      she has not had either  of those drugs in sometime.  17.Cozaar 100 mg daily.   DISCHARGE INSTRUCTIONS:  1. Low-sodium heart-healthy diabetic diet.  2. Increase activity slowly.  No lifting for 2 days.  No driving for 2      days.  3. Wash cath site with soap and water.  Call if any bleeding,      swelling, or drainage.  Follow up with Dr. Jacinto Halim in [place] on      May 19, 2008, at 10:45 and have lab work done on Monday,      May 02, 2008, with CBC and a basic metabolic panel.   Please note if your labs continue to be abnormal, we may send you to a  gastroenterologist.   This was told to the patient by myself and Dr. Tresa Endo.   DISCHARGE CONDITION:  Improved.   PROCEDURES:  Percutaneous  transluminal angioplasty and stent deployment  to the left renal artery and percutaneous transluminal angioplasty and  stent deployment to the left superficial femoral artery by Dr. Jacinto Halim on  April 27, 2008.   HISTORY OF PRESENT ILLNESS:  An 77 year old female patient of Dr. Mariah Milling  and Dr. Jacinto Halim and primary care of Dr. Tanya Nones was brought in for  elective PTA and stent to the renal artery and left SFA on April 27, 2008.  On April 18, 2008, she had had PV angiogram and was found to  have bilateral renal artery stenosis and bilateral SFA disease.  Dr.  Jacinto Halim wanted to wait 1-2 weeks for intervention due to renal  insufficiency.  The patient presented for short stay on April 27, 2008, to undergo procedure.  Since her previous PV angiogram on April 18, 2008, her hemoglobin had dropped.  At that previous time, her  preprocedure labs on April 11, 2008, revealed hemoglobin 1.5,  hematocrit 35, and platelets of 161, creatinine was 1.5 and then prior  to this procedure hemoglobin was 9.8, hematocrit 30.3, and platelets  148.  The patient underwent procedure with stenting as previously  described and did well on April 28, 2008, she had no complaints.  Her  hemoglobin was 8.9 with hematocrit of 25.5, and  platelets were down to  125.  We did check her stool, which was heme negative and iron panel is  pending.  We did decrease her aspirin to 81 mg daily and she will  continue on her omeprazole.  In discussing any abnormalities after her  previous procedure, she had no abdominal pain, no significant ecchymosis  or pain by previous cath site.   Looking back to May 2009, her platelets at that time had been 99,000  with a hemoglobin of 10.   PHYSICAL EXAMINATION ON THE DAY OF DISCHARGE:  VITAL SIGNS:  Blood  pressure 129/54, pulse 78, respiratory rate 18, temperature 99.7, oxygen  saturation on room air was 93.  She had no complaints.  HEART:  Regular rate and rhythm.  NECK:  Supple.  No JVD.  LUNGS:  Clear without rales.  Please note she also had 1/6 systolic  murmur.  EXTREMITIES:  Small ecchymoses, right groin.  ABDOMEN:  Nontender and positive bowel sounds.   CHEMISTRIES AT DISCHARGE:  Sodium 140, potassium 4.8, BUN 26, creatinine  1.21, and glucose 67, previously her creatinine had been 1.24.  When she  presented, her potassium was up slightly at 5, it had been 5.41 at point  as well.  Therefore, when I resumed her lisinopril I did a little lower  dose for now.  The patient ambulated in the hallway without problems and  she was ready for discharge home.  She will follow up with Dr. Jacinto Halim in  the office for followup PD and further plans for management.   If she continues with anemia and thrombocytopenia, she may need GI  consult,  MD to see and evaluate as an outpatient.      Darcella Gasman. [place], N.P.      Cristy Hilts. Jacinto Halim, MD  Electronically Signed    LRI/MEDQ  D:  05/02/2008  T:  05/03/2008  Job:  [id number]   cc:   Antonieta Iba, MD  Priscille Heidelberg. Pamalee Leyden, MD

## 2010-12-17 LAB — CBC
HCT: 22.9 — ABNORMAL LOW
HCT: 28.2 — ABNORMAL LOW
HCT: 28.9 — ABNORMAL LOW
HCT: 29.7 — ABNORMAL LOW
Hemoglobin: 10.1 — ABNORMAL LOW
Hemoglobin: 9.4 — ABNORMAL LOW
MCHC: 33.7
MCHC: 33.9
MCV: 88.8
MCV: 89.4
MCV: 90.3
MCV: 90.6
MCV: 90.9
Platelets: 106 — ABNORMAL LOW
Platelets: 109 — ABNORMAL LOW
Platelets: 133 — ABNORMAL LOW
Platelets: 133 — ABNORMAL LOW
Platelets: 200
RBC: 3.11 — ABNORMAL LOW
RDW: 14.9
RDW: 15.4
RDW: 15.6 — ABNORMAL HIGH
WBC: 3.7 — ABNORMAL LOW
WBC: 3.9 — ABNORMAL LOW
WBC: 4.1

## 2010-12-17 LAB — CARDIAC PANEL(CRET KIN+CKTOT+MB+TROPI)
CK, MB: 1.1
CK, MB: 1.2
Relative Index: INVALID
Relative Index: INVALID
Total CK: 78
Total CK: 87
Troponin I: 0.03

## 2010-12-17 LAB — BASIC METABOLIC PANEL
BUN: 37 — ABNORMAL HIGH
BUN: 37 — ABNORMAL HIGH
BUN: 41 — ABNORMAL HIGH
Calcium: 8.5
Chloride: 107
Chloride: 109
Chloride: 112
Creatinine, Ser: 1.42 — ABNORMAL HIGH
GFR calc Af Amer: 49 — ABNORMAL LOW
GFR calc non Af Amer: 33 — ABNORMAL LOW
Glucose, Bld: 97
Potassium: 4.6
Potassium: 4.8
Potassium: 4.9
Sodium: 140

## 2010-12-17 LAB — COMPREHENSIVE METABOLIC PANEL
Albumin: 3.5
BUN: 48 — ABNORMAL HIGH
Calcium: 8.9
Creatinine, Ser: 1.69 — ABNORMAL HIGH
Total Bilirubin: 0.5
Total Protein: 6

## 2010-12-17 LAB — ABO/RH: ABO/RH(D): O POS

## 2010-12-17 LAB — DIFFERENTIAL
Basophils Absolute: 0
Eosinophils Absolute: 0.1
Eosinophils Relative: 3
Monocytes Absolute: 0.4

## 2010-12-17 LAB — CROSSMATCH
ABO/RH(D): O POS
Antibody Screen: NEGATIVE

## 2010-12-17 LAB — PROTIME-INR: Prothrombin Time: 12.1

## 2010-12-17 LAB — LIPID PANEL
HDL: 22 — ABNORMAL LOW
LDL Cholesterol: 46
Triglycerides: 175 — ABNORMAL HIGH
VLDL: 35

## 2010-12-17 LAB — POCT CARDIAC MARKERS
CKMB, poc: <1 — ABNORMAL LOW
CKMB, poc: <1 — ABNORMAL LOW
Myoglobin, poc: 104
Operator id: 234501
Troponin i, poc: <0.05

## 2010-12-17 LAB — POCT I-STAT, CHEM 8
BUN: 62 — ABNORMAL HIGH
Chloride: 113 — ABNORMAL HIGH
HCT: 29 — ABNORMAL LOW
Potassium: 5.7 — ABNORMAL HIGH

## 2010-12-17 LAB — APTT
aPTT: 26
aPTT: 83 — ABNORMAL HIGH

## 2010-12-17 LAB — TSH: TSH: 0.326 — ABNORMAL LOW

## 2010-12-17 LAB — CK TOTAL AND CKMB (NOT AT ARMC)
CK, MB: 1.3
CK, MB: 1.3

## 2010-12-17 LAB — HEPARIN LEVEL (UNFRACTIONATED)
Heparin Unfractionated: 0.6
Heparin Unfractionated: <0.1 — ABNORMAL LOW

## 2010-12-17 LAB — OCCULT BLOOD X 1 CARD TO LAB, STOOL: Fecal Occult Bld: NEGATIVE

## 2010-12-18 LAB — CBC
Hemoglobin: 10.8 — ABNORMAL LOW
RBC: 3.54 — ABNORMAL LOW

## 2010-12-18 LAB — HEPARIN LEVEL (UNFRACTIONATED): Heparin Unfractionated: <0.1 — ABNORMAL LOW

## 2010-12-18 LAB — BASIC METABOLIC PANEL
Calcium: 8.7
Creatinine, Ser: 1.52 — ABNORMAL HIGH
GFR calc Af Amer: 40 — ABNORMAL LOW
GFR calc non Af Amer: 33 — ABNORMAL LOW
Sodium: 139

## 2011-03-01 IMAGING — CR DG CHEST 1V PORT
1 series · 1 of 1 positions shown · non-contrast
Comparison: Two-view chest x-ray 05/20/2009.  Portable chest x-ray
06/19/2007.

CLINICAL DATA: GI bleeding.  Central venous catheter placement.

PORTABLE CHEST - 1 VIEW [DATE]/[id number] hours:

[AP]
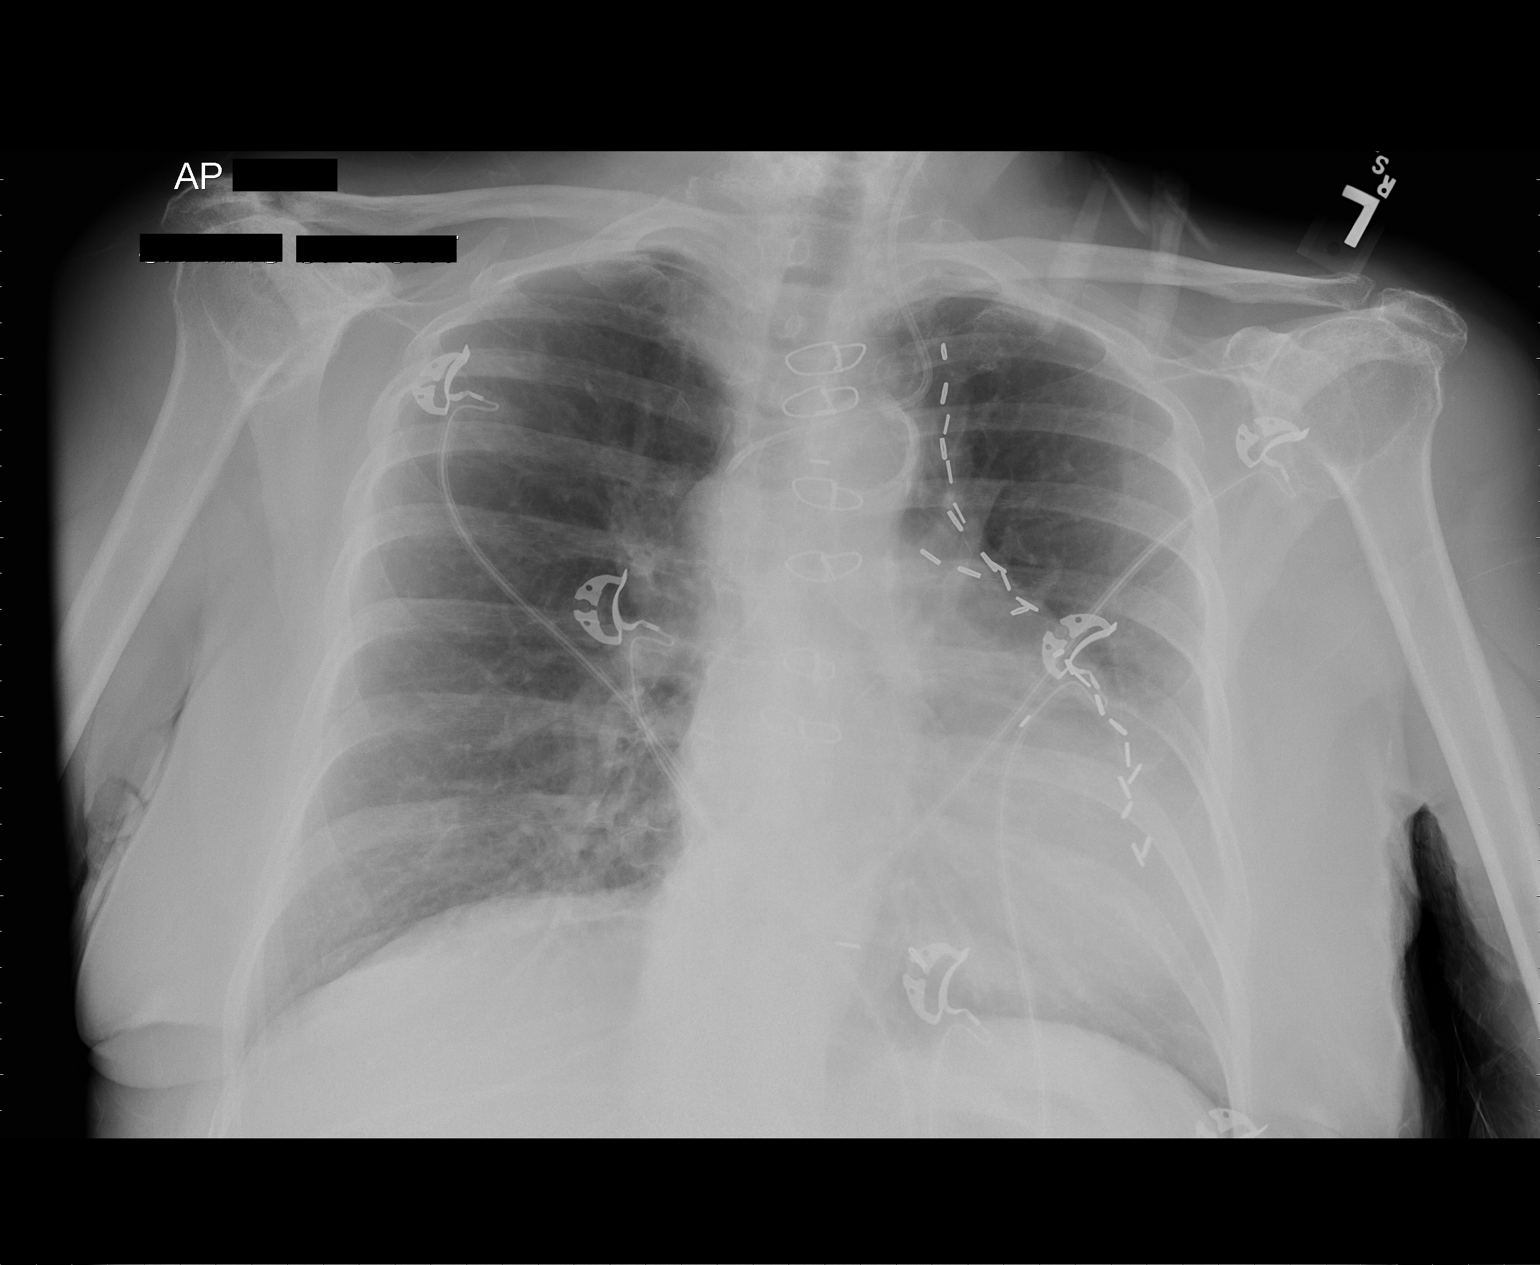

[1 of 1 positions shown; findings below may reference images not displayed]

FINDINGS: Prior sternotomy for CABG.  Cardiac silhouette mildly
enlarged for the AP portable technique, unchanged.  Pulmonary
vascularity normal without evidence of pulmonary edema.
Emphysematous changes in the upper lobes.  Lungs clear.  Left neck
central venous catheter has a more medial course in the neck than
normal, with its tip overlying the upper SVC (though the tip could
also be in the ascending thoracic aorta).  No evidence of
pneumothorax or mediastinal hematoma.
IMPRESSION: 1.  Left neck central venous catheter with a more medial course in
the neck than normal.  If venous blood was obtained, the tip is in
the upper SVC.
2.  No acute complicating features.
3.  Stable mild cardiomegaly post CABG.  COPD/emphysema.  No acute
cardiopulmonary disease.

## 2011-04-04 ENCOUNTER — Inpatient Hospital Stay (HOSPITAL_COMMUNITY)
Admission: EM | Admit: 2011-04-04 | Discharge: 2011-04-07 | DRG: 378 | Disposition: A | Discharge status: Home / Self Care | Payer: Medicare Other | Source: Ambulatory Visit | Attending: Internal Medicine | Admitting: Internal Medicine

## 2011-04-04 ENCOUNTER — Encounter (HOSPITAL_COMMUNITY): Payer: Self-pay

## 2011-04-04 DIAGNOSIS — I509 Heart failure, unspecified: Secondary | ICD-10-CM | POA: Diagnosis present

## 2011-04-04 DIAGNOSIS — M109 Gout, unspecified: Secondary | ICD-10-CM | POA: Diagnosis present

## 2011-04-04 DIAGNOSIS — I2510 Atherosclerotic heart disease of native coronary artery without angina pectoris: Secondary | ICD-10-CM | POA: Diagnosis present

## 2011-04-04 DIAGNOSIS — K219 Gastro-esophageal reflux disease without esophagitis: Secondary | ICD-10-CM | POA: Diagnosis present

## 2011-04-04 DIAGNOSIS — Z9181 History of falling: Secondary | ICD-10-CM

## 2011-04-04 DIAGNOSIS — Z79899 Other long term (current) drug therapy: Secondary | ICD-10-CM

## 2011-04-04 DIAGNOSIS — I10 Essential (primary) hypertension: Secondary | ICD-10-CM | POA: Diagnosis present

## 2011-04-04 DIAGNOSIS — Z6828 Body mass index (BMI) 28.0-28.9, adult: Secondary | ICD-10-CM

## 2011-04-04 DIAGNOSIS — R269 Unspecified abnormalities of gait and mobility: Secondary | ICD-10-CM | POA: Diagnosis present

## 2011-04-04 DIAGNOSIS — Z9861 Coronary angioplasty status: Secondary | ICD-10-CM

## 2011-04-04 DIAGNOSIS — Z951 Presence of aortocoronary bypass graft: Secondary | ICD-10-CM

## 2011-04-04 DIAGNOSIS — E039 Hypothyroidism, unspecified: Secondary | ICD-10-CM | POA: Diagnosis present

## 2011-04-04 DIAGNOSIS — K922 Gastrointestinal hemorrhage, unspecified: Secondary | ICD-10-CM | POA: Diagnosis present

## 2011-04-04 DIAGNOSIS — Z888 Allergy status to other drugs, medicaments and biological substances status: Secondary | ICD-10-CM

## 2011-04-04 DIAGNOSIS — I209 Angina pectoris, unspecified: Secondary | ICD-10-CM | POA: Diagnosis present

## 2011-04-04 DIAGNOSIS — I739 Peripheral vascular disease, unspecified: Secondary | ICD-10-CM | POA: Diagnosis present

## 2011-04-04 DIAGNOSIS — Z860100 Personal history of colon polyps, unspecified: Secondary | ICD-10-CM

## 2011-04-04 DIAGNOSIS — Z87891 Personal history of nicotine dependence: Secondary | ICD-10-CM

## 2011-04-04 DIAGNOSIS — M129 Arthropathy, unspecified: Secondary | ICD-10-CM | POA: Diagnosis present

## 2011-04-04 DIAGNOSIS — E119 Type 2 diabetes mellitus without complications: Secondary | ICD-10-CM | POA: Diagnosis present

## 2011-04-04 DIAGNOSIS — K5731 Diverticulosis of large intestine without perforation or abscess with bleeding: Principal | ICD-10-CM | POA: Diagnosis present

## 2011-04-04 DIAGNOSIS — I5032 Chronic diastolic (congestive) heart failure: Secondary | ICD-10-CM | POA: Diagnosis present

## 2011-04-04 DIAGNOSIS — Z7902 Long term (current) use of antithrombotics/antiplatelets: Secondary | ICD-10-CM

## 2011-04-04 DIAGNOSIS — Z7982 Long term (current) use of aspirin: Secondary | ICD-10-CM

## 2011-04-04 HISTORY — DX: Peripheral vascular disease, unspecified: I73.9

## 2011-04-04 HISTORY — DX: Gastrointestinal hemorrhage, unspecified: K92.2

## 2011-04-04 HISTORY — DX: Gastro-esophageal reflux disease without esophagitis: K21.9

## 2011-04-04 HISTORY — DX: Ischemic cardiomyopathy: I25.5

## 2011-04-04 HISTORY — DX: Encounter for other specified aftercare: Z51.89

## 2011-04-04 HISTORY — DX: Heart failure, unspecified: I50.9

## 2011-04-04 HISTORY — DX: Anemia, unspecified: D64.9

## 2011-04-04 HISTORY — DX: Unspecified osteoarthritis, unspecified site: M19.90

## 2011-04-04 HISTORY — DX: Reserved for inherently not codable concepts without codable children: IMO0001

## 2011-04-04 HISTORY — DX: Pure hypercholesterolemia, unspecified: E78.00

## 2011-04-04 HISTORY — DX: Essential (primary) hypertension: I10

## 2011-04-04 HISTORY — DX: Hypothyroidism, unspecified: E03.9

## 2011-04-04 HISTORY — DX: Pneumonia, unspecified organism: J18.9

## 2011-04-04 LAB — TYPE AND SCREEN
Antibody Screen: NEGATIVE
Unit division: 0

## 2011-04-04 LAB — BASIC METABOLIC PANEL
BUN: 25 mg/dL — ABNORMAL HIGH (ref 6–23)
CO2: 26 mEq/L (ref 19–32)
Chloride: 105 mEq/L (ref 96–112)
Creatinine, Ser: 0.86 mg/dL (ref 0.50–1.10)
GFR calc Af Amer: 68 mL/min — ABNORMAL LOW (ref >90)
Potassium: 3.5 mEq/L (ref 3.5–5.1)

## 2011-04-04 LAB — CBC
HCT: 29.3 % — ABNORMAL LOW (ref 36.0–46.0)
HCT: 28.8 % — ABNORMAL LOW (ref 36.0–46.0)
HCT: 27 % — ABNORMAL LOW (ref 36.0–46.0)
Hemoglobin: 9.7 g/dL — ABNORMAL LOW (ref 12.0–15.0)
Hemoglobin: 8.9 g/dL — ABNORMAL LOW (ref 12.0–15.0)
MCH: 30.7 pg (ref 26.0–34.0)
MCV: 92.7 fL (ref 78.0–100.0)
MCV: 92.9 fL (ref 78.0–100.0)
MCV: 93.1 fL (ref 78.0–100.0)
Platelets: 169 10*3/uL (ref 150–400)
RBC: 3.16 MIL/uL — ABNORMAL LOW (ref 3.87–5.11)
RBC: 3.1 MIL/uL — ABNORMAL LOW (ref 3.87–5.11)
RBC: 2.9 MIL/uL — ABNORMAL LOW (ref 3.87–5.11)
RDW: 14.8 % (ref 11.5–15.5)
WBC: 6.1 10*3/uL (ref 4.0–10.5)
WBC: 5.7 10*3/uL (ref 4.0–10.5)

## 2011-04-04 LAB — GLUCOSE, CAPILLARY
Glucose-Capillary: 133 mg/dL — ABNORMAL HIGH (ref 70–99)
Glucose-Capillary: 107 mg/dL — ABNORMAL HIGH (ref 70–99)

## 2011-04-04 LAB — CARDIAC PANEL(CRET KIN+CKTOT+MB+TROPI)
CK, MB: 1.8 ng/mL (ref 0.3–4.0)
Total CK: 52 U/L (ref 7–177)
Troponin I: <0.3 ng/mL (ref <0.30)

## 2011-04-04 LAB — HEMOGLOBIN A1C: Mean Plasma Glucose: 128 mg/dL — ABNORMAL HIGH (ref <117)

## 2011-04-04 LAB — PROTIME-INR: Prothrombin Time: 14.4 seconds (ref 11.6–15.2)

## 2011-04-04 MED ORDER — HYDRALAZINE HCL 25 MG PO TABS
25.0000 mg | ORAL_TABLET | Freq: Four times a day (QID) | ORAL | Status: DC
  Administered 2011-04-04 (×2): 25 mg via ORAL
  Filled 2011-04-04 (×4): qty 1

## 2011-04-04 MED ORDER — ONDANSETRON HCL 4 MG PO TABS: 4.0000 mg | ORAL_TABLET | Freq: Four times a day (QID) | ORAL | Status: DC | PRN

## 2011-04-04 MED ORDER — ACETAMINOPHEN 325 MG PO TABS: 650.0000 mg | ORAL_TABLET | Freq: Four times a day (QID) | ORAL | Status: DC | PRN

## 2011-04-04 MED ORDER — ONDANSETRON HCL 4 MG/2ML IJ SOLN: 4.0000 mg | Freq: Four times a day (QID) | INTRAMUSCULAR | Status: DC | PRN

## 2011-04-04 MED ORDER — ALLOPURINOL 300 MG PO TABS
300.0000 mg | ORAL_TABLET | ORAL | Status: DC
  Administered 2011-04-04 – 2011-04-07 (×4): 300 mg via ORAL
  Filled 2011-04-04 (×5): qty 1

## 2011-04-04 MED ORDER — LINAGLIPTIN 5 MG PO TABS
5.0000 mg | ORAL_TABLET | Freq: Every day | ORAL | Status: DC
  Administered 2011-04-04 – 2011-04-07 (×4): 5 mg via ORAL
  Filled 2011-04-04 (×4): qty 1

## 2011-04-04 MED ORDER — DIPHENHYDRAMINE HCL 25 MG PO CAPS
25.0000 mg | ORAL_CAPSULE | Freq: Once | ORAL | Status: AC
  Administered 2011-04-04: 25 mg via ORAL
  Filled 2011-04-04: qty 1

## 2011-04-04 MED ORDER — CARVEDILOL PHOSPHATE ER 20 MG PO CP24
20.0000 mg | ORAL_CAPSULE | Freq: Every evening | ORAL | Status: DC
  Filled 2011-04-04: qty 1

## 2011-04-04 MED ORDER — RANOLAZINE ER 500 MG PO TB12
500.0000 mg | ORAL_TABLET | ORAL | Status: DC
  Administered 2011-04-04 – 2011-04-07 (×4): 500 mg via ORAL
  Filled 2011-04-04 (×5): qty 1

## 2011-04-04 MED ORDER — SODIUM CHLORIDE 0.9 % IV SOLN: INTRAVENOUS | Status: DC

## 2011-04-04 MED ORDER — SERTRALINE HCL 50 MG PO TABS
50.0000 mg | ORAL_TABLET | ORAL | Status: DC
  Administered 2011-04-04 – 2011-04-07 (×4): 50 mg via ORAL
  Filled 2011-04-04 (×6): qty 1

## 2011-04-04 MED ORDER — NITROGLYCERIN 0.4 MG SL SUBL: 0.4000 mg | SUBLINGUAL_TABLET | SUBLINGUAL | Status: DC | PRN

## 2011-04-04 MED ORDER — ISOSORBIDE MONONITRATE ER 60 MG PO TB24
60.0000 mg | ORAL_TABLET | ORAL | Status: DC
  Administered 2011-04-04: 60 mg via ORAL
  Filled 2011-04-04 (×2): qty 1

## 2011-04-04 MED ORDER — ACETAMINOPHEN 650 MG RE SUPP: 650.0000 mg | Freq: Four times a day (QID) | RECTAL | Status: DC | PRN

## 2011-04-04 MED ORDER — ACETAMINOPHEN 325 MG PO TABS
650.0000 mg | ORAL_TABLET | Freq: Once | ORAL | Status: AC
  Administered 2011-04-04: 650 mg via ORAL
  Filled 2011-04-04: qty 2

## 2011-04-04 MED ORDER — PANTOPRAZOLE SODIUM 40 MG PO TBEC
40.0000 mg | DELAYED_RELEASE_TABLET | Freq: Every day | ORAL | Status: DC
  Administered 2011-04-04: 40 mg via ORAL
  Filled 2011-04-04: qty 1

## 2011-04-04 MED ORDER — SODIUM CHLORIDE 0.9 % IV SOLN
250.0000 mL | INTRAVENOUS | Status: DC | PRN
  Administered 2011-04-04: 1000 mL via INTRAVENOUS

## 2011-04-04 MED ORDER — SODIUM CHLORIDE 0.9 % IJ SOLN: 3.0000 mL | INTRAMUSCULAR | Status: DC | PRN

## 2011-04-04 MED ORDER — CALCIUM CARBONATE-VITAMIN D 500-200 MG-UNIT PO TABS
1.0000 | ORAL_TABLET | Freq: Every evening | ORAL | Status: DC
  Administered 2011-04-04 – 2011-04-06 (×3): 1 via ORAL
  Filled 2011-04-04 (×4): qty 1

## 2011-04-04 MED ORDER — FUROSEMIDE 20 MG PO TABS
20.0000 mg | ORAL_TABLET | Freq: Every day | ORAL | Status: DC
  Administered 2011-04-04: 20 mg via ORAL
  Filled 2011-04-04: qty 1

## 2011-04-04 MED ORDER — ZOLPIDEM TARTRATE 5 MG PO TABS: 5.0000 mg | ORAL_TABLET | Freq: Every evening | ORAL | Status: DC | PRN

## 2011-04-04 MED ORDER — ROSUVASTATIN CALCIUM 20 MG PO TABS
20.0000 mg | ORAL_TABLET | Freq: Every evening | ORAL | Status: DC
  Administered 2011-04-04 – 2011-04-06 (×3): 20 mg via ORAL
  Filled 2011-04-04 (×4): qty 1

## 2011-04-04 MED ORDER — PANTOPRAZOLE SODIUM 40 MG IV SOLR
40.0000 mg | Freq: Two times a day (BID) | INTRAVENOUS | Status: DC
  Administered 2011-04-04: 40 mg via INTRAVENOUS
  Filled 2011-04-04 (×3): qty 40

## 2011-04-04 MED ORDER — SODIUM CHLORIDE 0.9 % IJ SOLN
3.0000 mL | Freq: Two times a day (BID) | INTRAMUSCULAR | Status: DC
  Administered 2011-04-04 – 2011-04-07 (×6): 3 mL via INTRAVENOUS

## 2011-04-04 MED ORDER — HYDROCODONE-ACETAMINOPHEN 5-325 MG PO TABS: 1.0000 | ORAL_TABLET | ORAL | Status: DC | PRN

## 2011-04-04 MED ORDER — AMLODIPINE BESYLATE 5 MG PO TABS
5.0000 mg | ORAL_TABLET | Freq: Every day | ORAL | Status: DC
  Administered 2011-04-04: 5 mg via ORAL
  Filled 2011-04-04: qty 1

## 2011-04-04 MED ORDER — LEVOTHYROXINE SODIUM 88 MCG PO TABS
88.0000 ug | ORAL_TABLET | Freq: Every day | ORAL | Status: DC
  Administered 2011-04-05 – 2011-04-07 (×3): 88 ug via ORAL
  Filled 2011-04-04 (×4): qty 1

## 2011-04-04 MED ORDER — FERROUS SULFATE 325 (65 FE) MG PO TABS
325.0000 mg | ORAL_TABLET | Freq: Every day | ORAL | Status: DC
  Administered 2011-04-05 – 2011-04-07 (×3): 325 mg via ORAL
  Filled 2011-04-04 (×4): qty 1

## 2011-04-04 NOTE — ED Notes (Signed)
Patient resting with NAD at this time. Patient remains on monitor and saturation of 96% on RA.

## 2011-04-04 NOTE — ED Notes (Signed)
Patient resting with NAD at this time. Patient's son gone home to return later. Patient remains on monitor and saturation is 98% on RA.

## 2011-04-04 NOTE — ED Provider Notes (Signed)
History     CSN: [id number]  Arrival date & time 04/04/11  4098   First MD Initiated Contact with Patient 04/04/11 [phone]      Chief Complaint  Patient presents with  . Rectal Bleeding    (Consider location/radiation/quality/duration/timing/severity/associated sxs/prior treatment) Patient is a 78 y.o. female presenting with hematochezia. The history is provided by the patient. No language interpreter was used.  Rectal Bleeding  The current episode started today. Episode frequency: Once. The problem has been unchanged. The patient is experiencing no pain. The stool is described as bloody. There was no prior successful therapy. There was no prior unsuccessful therapy. Associated symptoms include nausea (mild, none on arrival). Pertinent negatives include no anorexia, no fever, no abdominal pain, no diarrhea, no hematemesis, no hemorrhoids, no rectal pain, no vomiting, no hematuria, no vaginal bleeding, no vaginal discharge, no chest pain, no coughing and no difficulty breathing. Her past medical history does not include abdominal surgery, Hirschsprung's disease, inflammatory bowel disease or recent abdominal injury. There were no sick contacts. She has received no recent medical care.    Past Medical History  Diagnosis Date  . CHF (congestive heart failure)   . Hypertension   . Diabetes mellitus   . Thyroid disease     Past Surgical History  Procedure Date  . Abdominal hysterectomy   . Coronary artery bypass graft   . Bladder suspension     No family history on file.  History  Substance Use Topics  . Smoking status: Never Smoker   . Smokeless tobacco: Not on file  . Alcohol Use: Yes    OB History    Grav Para Term Preterm Abortions TAB SAB Ect Mult Living                  Review of Systems  Constitutional: Negative for fever and chills.  Respiratory: Negative for cough and shortness of breath.   Cardiovascular: Negative for chest pain and leg swelling.    Gastrointestinal: Positive for nausea (mild, none on arrival) and hematochezia. Negative for vomiting, abdominal pain, diarrhea, rectal pain, anorexia, hematemesis and hemorrhoids.  Genitourinary: Negative for hematuria, vaginal bleeding and vaginal discharge.  All other systems reviewed and are negative.    Allergies  Ampicillin  Home Medications   Current Outpatient Rx  Name Route Sig Dispense Refill  . ALLOPURINOL 300 MG PO TABS Oral Take 300 mg by mouth every morning.    Marland Kitchen AMLODIPINE BESYLATE 10 MG PO TABS Oral Take 5 mg by mouth daily.    . ASPIRIN EC 81 MG PO TBEC Oral Take 81 mg by mouth every morning.    Marland Kitchen CALCIUM CARBONATE-VITAMIN D 500-200 MG-UNIT PO TABS Oral Take 1 tablet by mouth every evening.    Marland Kitchen CARVEDILOL PHOSPHATE ER 20 MG PO CP24 Oral Take 20 mg by mouth every evening.    Marland Kitchen CLOPIDOGREL BISULFATE 75 MG PO TABS Oral Take 75 mg by mouth every morning.    Marland Kitchen FERROUS SULFATE 325 (65 FE) MG PO TABS Oral Take 325 mg by mouth daily with breakfast.    . FUROSEMIDE 40 MG PO TABS Oral Take 20 mg by mouth daily.    Marland Kitchen HYDRALAZINE HCL 25 MG PO TABS Oral Take 25 mg by mouth 4 (four) times daily.    . ISOSORBIDE MONONITRATE ER 60 MG PO TB24 Oral Take 60 mg by mouth every morning.    Marland Kitchen LEVOTHYROXINE SODIUM 88 MCG PO TABS Oral Take 88 mcg by mouth every  morning.    Marland Kitchen NITROGLYCERIN 0.4 MG SL SUBL Sublingual Place 0.4 mg under the tongue every 5 (five) minutes as needed. For chest pain    . OMEPRAZOLE 20 MG PO CPDR Oral Take 20 mg by mouth every morning.    Marland Kitchen RANOLAZINE ER 500 MG PO TB12 Oral Take 500 mg by mouth every morning.    Marland Kitchen ROSUVASTATIN CALCIUM 20 MG PO TABS Oral Take 20 mg by mouth every evening.    Marland Kitchen SERTRALINE HCL 50 MG PO TABS Oral Take 50 mg by mouth every morning.    Marland Kitchen SITAGLIPTIN PHOSPHATE 50 MG PO TABS Oral Take 100 mg by mouth daily.      BP 161/51  Pulse 69  Temp(Src) 98.2 F (36.8 C) (Oral)  Resp 18  SpO2 98%  Physical Exam  Nursing note and vitals  reviewed. Constitutional: She is oriented to person, place, and time. She appears well-developed and well-nourished. No distress.  HENT:  Head: Normocephalic and atraumatic.  Eyes: EOM are normal. Pupils are equal, round, and reactive to light.  Neck: Normal range of motion. Neck supple.  Cardiovascular: Normal rate and regular rhythm.  Exam reveals no friction rub.   No murmur heard. Pulmonary/Chest: Effort normal and breath sounds normal. No respiratory distress. She has no wheezes. She has no rales.  Abdominal: Soft. She exhibits no distension. There is no tenderness. There is no rebound.  Genitourinary: Rectal exam shows no external hemorrhoid, no fissure, no mass, no tenderness and anal tone normal. Guaiac positive stool (Grossly positive).  Musculoskeletal: Normal range of motion. She exhibits no edema.  Neurological: She is alert and oriented to person, place, and time.  Skin: She is not diaphoretic.    ED Course  Procedures (including critical care time)  Labs Reviewed  CBC - Abnormal; Notable for the following:    RBC 3.16 (*)    Hemoglobin 9.9 (*)    HCT 29.3 (*)    All other components within normal limits  BASIC METABOLIC PANEL - Abnormal; Notable for the following:    Glucose, Bld 145 (*)    BUN 25 (*)    GFR calc non Af Amer 59 (*)    GFR calc Af Amer 68 (*)    All other components within normal limits  OCCULT BLOOD, POC DEVICE  TYPE AND SCREEN  POCT OCCULT BLOOD STOOL, DEVICE   No results found.   1. Lower GI bleed      Date: 04/04/2011  Rate: 64  Rhythm: normal sinus rhythm  QRS Axis: normal  Intervals: QT prolonged  ST/T Wave abnormalities: nonspecific T wave changes  Conduction Disutrbances:right bundle branch block  Narrative Interpretation:   Old EKG Reviewed: none available    MDM  78 year old female presents with rectal bleeding. Began this morning with 1 bloody bowel movement with associated clots. Normal bowel movements yesterday until the  evening, which he had to episodes of diarrhea. History of GI bleed last year when she was on anticoagulation. At that time had a colonoscopy which showed polyps. Patient is afebrile vital signs are stable. Patient denies dizziness, chest pain, syncope. Patient states occasional nausea, however none now. Mild cramping during a bowel movement, however no abdominal pain now. Belly exam is benign. Patient has grossly bloody stool on rectal exam. I believe patient has a lower GI bleed. She's not on Coumadin and does not have chronic liver disease therefore no coagulation studies needed. With history CHF will not of fluid since she is  hemodynamically stable.  Will consult for admission once laboratory studies return. Hgb 9.9. No recent hemoglobins to compare to. Teaching serivce is admitting.      Elwin Mocha, MD 04/04/11 [phone]

## 2011-04-04 NOTE — ED Notes (Signed)
Patient states she went to the bathroom this morning and felt like she had to have BM and instead started bleeding from her rectum. Patient states she has has 2 or 3 days when she felt full all day and did not eat. Patient states that yesterday she had 3 or 4 normal BM;s in the morning and  2 episodes of diarrhea in the afternoon. Patient denies chest pain or and general pain. Patient placed on monitor and is saturation at 98% on room air.  Patient's son at bedside.

## 2011-04-04 NOTE — ED Notes (Signed)
Pt states that starting yesterday she developed some rectal bleeding with bowel movements. She denies any pain. Pt states that when she woke up this morning the bleeding had increased. She describes the blood as bright red.

## 2011-04-04 NOTE — ED Provider Notes (Signed)
I saw and evaluated the patient, reviewed the resident's note and I agree with the findings and plan.  Nicholes Stairs, MD 04/04/11 [phone]

## 2011-04-04 NOTE — H&P (Signed)
Hospital Admission Note Date: 04/04/2011  Patient name: Alison Buckley Medical record number: [id number] Date of birth: 02-14-1923 Age: 78 y.o. Gender: female PCP: Leo Grosser, MD, MD     Medical Service: [facility]  Attending physician:  Dr. Rogelia Boga     1st Contact:     Dr. Candy Sledge   Pager: [phone] 2nd Contact:   Dr. Anselm Jungling   Pager: [phone]  After 5 pm or weekends: 1st Contact:      Pager: [phone] 2nd Contact:      Pager: [phone]   Chief Complaint: GI Bleed   History of Present Illness: Patient is a 78 y.o. female with a past medical history for CHF, HTN, DM, Hypothyroidism, CAD on asa and Plavix, she also has a history of GI bleed in 04/2010 that was attributed to polyps, presenting today to [facility] ED with hematochezia that started this morning, when she woke up and felt the need to have a bowel movement, when she proceeded to the toilet, she noted an episode of bright red blood per rectum with some clots in it. Volume was moderate. This prompted the patient to come to the ED for further evaluation. Denies any CP, weakness, SOB or any other complaints.   Meds: Medications Prior to Admission  Medication Dose Route Frequency Provider Last Rate Last Dose  . 0.9 %  sodium chloride infusion   Intravenous Continuous Nicholes Stairs, MD       No current outpatient prescriptions on file as of 04/04/2011.    Allergies: Ampicillin Past Medical History  Diagnosis Date  . CHF (congestive heart failure)   . Hypertension   . Diabetes mellitus   . Thyroid disease    Past Surgical History  Procedure Date  . Abdominal hysterectomy   . Coronary artery bypass graft   . Bladder suspension    No family history on file. History   Social History  . Marital Status: Widowed    Spouse Name: N/A    Number of Children: N/A  . Years of Education: N/A   Occupational History  . Not on file.   Social History Main Topics  . Smoking status: Never Smoker   . Smokeless tobacco: Not on file  .  Alcohol Use: Yes  . Drug Use: No  . Sexually Active:    Other Topics Concern  . Not on file   Social History Narrative  . No narrative on file    Review of Systems: Negative except per HPI  Physical Exam: Blood pressure 161/51, pulse 69, temperature 98.2 F (36.8 C), temperature source Oral, resp. rate 18, SpO2 98.00%. Nursing note and vitals reviewed.  Constitutional: She is oriented to person, place, and time. She appears well-developed and well-nourished. No distress.  Head: Normocephalic and atraumatic.  Eyes: EOM are normal. Pupils are equal, round, and reactive to light.  Neck: Normal range of motion. Neck supple.  Cardiovascular: Normal rate and regular rhythm. 2/6 SEM radiating to carotids. Exam reveals no friction rub.   Pulmonary/Chest: Effort normal and breath sounds normal. No respiratory distress. She has no wheezes. She has no rales.  Abdominal: Soft. She exhibits no distension. There is no tenderness. There is no rebound.  Genitourinary: Rectal exam shows no external hemorrhoid, no fissure, no mass, no tenderness and anal tone normal. Guaiac positive stool (Grossly positive).  Musculoskeletal: Normal range of motion. She exhibits no edema.  Neurological: She is alert and oriented to person, place, and time.  Skin: She is not diaphoretic.   Lab  results: Basic Metabolic Panel:  Basename 04/04/11 0803  NA 140  K 3.5  CL 105  CO2 26  GLUCOSE 145*  BUN 25*  CREATININE 0.86  CALCIUM 8.7  MG --  PHOS --   CBC:  Basename 04/04/11 0803  WBC 6.1  NEUTROABS --  HGB 9.9*  HCT 29.3*  MCV 92.7  PLT 169   Assessment & Plan by Problem:  GI bleed- Patient had one episode of GI bleed this morning, with no further episodes noted, her baseline hemoglobin is 11-13, currently it is 9.9, note that she had one episode of GI bleed in the past in 04/2010, endoscopy performed by Dr. Arlyce Dice which demonstrated; Two polyps in the mid transverse colon, Moderate diverticulosis  in the sigmoid to descending colon segments. At the time her GI bleed was attributed to Hematochezia secondary to diverticular bleed. will continue to monitor CBC every 4 hours, and transfuse if her hemoglobin drops below 8, will hold aspirin and Plavix for now, and consult GI.   CHF (congestive heart failure)- Patient's volume status appears at baseline, continue home Lasix and restrict fluid intake, continue to monitor daily weights and volume status.  CAD (coronary artery disease)- Patient has a history of CABG and multiple PCI's, currently chest pain-free, given her GI bleeds will order cardiac enzymes to assess for demand ischemia  HTN (hypertension)- Patient has not taken her medications this morning, will restart her home meds and monitor blood pressure  DM (diabetes mellitus)- Checking A1c, continuing home medications and checking CBGs q AC HS.  Hypothyroid- Continue home Synthroid, check TSH  Hyperlipidemia- Continue home statin, and check a.m. fasting lipids   Signed: Fionnuala Hemmerich 04/04/2011, 9:46 AM

## 2011-04-04 NOTE — H&P (Signed)
Internal Medicine Teaching Service Attending Note Date: 04/04/2011  Patient name: Alison Buckley  Medical record number: [id number]  Date of birth: [birth year]   I have seen and evaluated Elayne Snare and discussed their care with the Residency Team.  Please see Dr Rosemarie Ax H&P for full details. Briefly, pt woke up in usual state of health and felt need to defecate. Had painless BRBPR. No assoc dizziness, CP, vomiting. Had second episode of BRBPR in ER. Had similar episode in Feb 2012, received 2 units PRBC, colonoscopy showed moderate diverticulosis, 2 tubular adenomas. Cause of bleed attributed to diverticular bleed. In ER, pt was not orthostatic when sitting up. Her HgB was 8.9, down from 9.9 on admit to ER, and down from baseline 8.9 of 11-12 ish in Feb 2012.  She has CAD and CHF. ECHO 2/12 showed EF of 50-55% and grade I diastolic dysfxn.  Physical Exam: Blood pressure 153/56, pulse 65, temperature 98.3 F (36.8 C), temperature source Oral, resp. rate 21, height 4\' 11"  (1.499 m), weight 138 lb 0.1 oz (62.6 kg), SpO2 98.00%. NAD. Lying in bed. Pleasant. ABD : + BS, S NT ND. No distended liver edge. Slightly diminished R PD pulse and nl on L Scab that was removed on R 5th toe. Underlying skin nl. 2 mm hyperpigmented lesion lateral 4th toe  Lab results: Results for orders placed during the hospital encounter of 04/04/11 (from the past 24 hour(s))  CBC     Status: Abnormal   Collection Time   04/04/11  8:03 AM      Component Value Range   WBC 6.1  4.0 - 10.5 (K/uL)   RBC 3.16 (*) 3.87 - 5.11 (MIL/uL)   Hemoglobin 9.9 (*) 12.0 - 15.0 (g/dL)   HCT 40.9 (*) 81.1 - 46.0 (%)   MCV 92.7  78.0 - 100.0 (fL)   MCH 31.3  26.0 - 34.0 (pg)   MCHC 33.8  30.0 - 36.0 (g/dL)   RDW 91.4  78.2 - 95.6 (%)   Platelets 169  150 - 400 (K/uL)  BASIC METABOLIC PANEL     Status: Abnormal   Collection Time   04/04/11  8:03 AM      Component Value Range   Sodium 140  135 - 145 (mEq/L)   Potassium 3.5  3.5 - 5.1  (mEq/L)   Chloride 105  96 - 112 (mEq/L)   CO2 26  19 - 32 (mEq/L)   Glucose, Bld 145 (*) 70 - 99 (mg/dL)   BUN 25 (*) 6 - 23 (mg/dL)   Creatinine, Ser 2.13  0.50 - 1.10 (mg/dL)   Calcium 8.7  8.4 - 08.6 (mg/dL)   GFR calc non Af Amer 59 (*) >90 (mL/min)   GFR calc Af Amer 68 (*) >90 (mL/min)  OCCULT BLOOD, POC DEVICE     Status: Normal   Collection Time   04/04/11  8:23 AM      Component Value Range   Fecal Occult Bld POSITIVE    TYPE AND SCREEN     Status: Normal   Collection Time   04/04/11  8:53 AM      Component Value Range   ABO/RH(D) O POS     Antibody Screen NEG     Sample Expiration 04/07/2011    GLUCOSE, CAPILLARY     Status: Abnormal   Collection Time   04/04/11 11:17 AM      Component Value Range   Glucose-Capillary 136 (*) 70 - 99 (mg/dL)  CBC  Status: Abnormal   Collection Time   04/04/11 11:54 AM      Component Value Range   WBC 5.7  4.0 - 10.5 (K/uL)   RBC 3.10 (*) 3.87 - 5.11 (MIL/uL)   Hemoglobin 9.7 (*) 12.0 - 15.0 (g/dL)   HCT 16.1 (*) 09.6 - 46.0 (%)   MCV 92.9  78.0 - 100.0 (fL)   MCH 31.3  26.0 - 34.0 (pg)   MCHC 33.7  30.0 - 36.0 (g/dL)   RDW 04.5  40.9 - 81.1 (%)   Platelets 169  150 - 400 (K/uL)  PROTIME-INR     Status: Normal   Collection Time   04/04/11  1:42 PM      Component Value Range   Prothrombin Time 14.4  11.6 - 15.2 (seconds)   INR 1.10  0.00 - 1.49   APTT     Status: Normal   Collection Time   04/04/11  1:42 PM      Component Value Range   aPTT 28  24 - 37 (seconds)  CBC     Status: Abnormal   Collection Time   04/04/11  4:06 PM      Component Value Range   WBC 5.9  4.0 - 10.5 (K/uL)   RBC 2.90 (*) 3.87 - 5.11 (MIL/uL)   Hemoglobin 8.9 (*) 12.0 - 15.0 (g/dL)   HCT 91.4 (*) 78.2 - 46.0 (%)   MCV 93.1  78.0 - 100.0 (fL)   MCH 30.7  26.0 - 34.0 (pg)   MCHC 33.0  30.0 - 36.0 (g/dL)   RDW 95.6  21.3 - 08.6 (%)   Platelets 167  150 - 400 (K/uL)    Imaging results:  No results found.  Assessment and Plan: I agree with the  formulated Assessment and Plan with the following changes:   1. GI bleed - hemodynamically stable although HgB continues to decrease. Type and cross. Transfuse for HgB < 10 due to h/o CAD - she is currently at 8.9 but is stable. Hold ASA and Plavix.   Likely cause is another diverticular bleed. Other possibilities inc AVM or hemorrhoid bleed. She had no prior pain that would make ischemic colitis likely. Gi has been consulted. Clear liquids for now in case needs intervention.  2. CHF diastolic chronic stable - lasix is being held. No hydration now as not orthostatic. Will need to follow volume status closely.   3. CAD - pt seems to have stable angina - occurs when she is exertional (moving her arms a lot when bathing and dressing). Only happens rarely - once or twice a month. Has been told no intervention possible. On max medical therapy although meds will be held until stable.   4. Unsteady gait - uses walker to assist. Can only walk short distances 2/2 DOE. Has had falls. P/OT once bleeding resolved.

## 2011-04-04 NOTE — ED Provider Notes (Addendum)
I saw and evaluated the patient, reviewed the resident's note and I agree with the findings and plan. 78 year old, female, presents with painless bright red blood parenchyma.  Today.  She denies lightheadedness, or shortness of breath.  She had similar symptoms a year ago and had a colonoscopy, which showed a polyp, but no other significant lesions.  She has not had nights sweats or unexplained weight loss.  On physical examination.  She is in no distress.  We will perform laboratory testing, and arrange for dementia, and for further diagnostic testing.  Nicholes Stairs, MD 04/04/11 [phone]  ED ECG REPORT   Date: 04/04/2011  EKG Time: 9:18 AM  Rate: 64  Rhythm: normal sinus rhythm,  RBBB  Axis: right  Intervals:right bundle branch block  ST&T Change: nonspecific  Narrative Interpretation: nsr with rbbb            Nicholes Stairs, MD 04/04/11 [phone]

## 2011-04-05 ENCOUNTER — Encounter (HOSPITAL_COMMUNITY): Payer: Self-pay | Admitting: Cardiology

## 2011-04-05 DIAGNOSIS — I739 Peripheral vascular disease, unspecified: Secondary | ICD-10-CM

## 2011-04-05 HISTORY — DX: Peripheral vascular disease, unspecified: I73.9

## 2011-04-05 LAB — GLUCOSE, CAPILLARY

## 2011-04-05 LAB — CBC
HCT: 27.1 % — ABNORMAL LOW (ref 36.0–46.0)
HCT: 27.4 % — ABNORMAL LOW (ref 36.0–46.0)
HCT: 28 % — ABNORMAL LOW (ref 36.0–46.0)
HCT: 28.2 % — ABNORMAL LOW (ref 36.0–46.0)
HCT: 31.4 % — ABNORMAL LOW (ref 36.0–46.0)
Hemoglobin: 9.7 g/dL — ABNORMAL LOW (ref 12.0–15.0)
Hemoglobin: 9.4 g/dL — ABNORMAL LOW (ref 12.0–15.0)
Hemoglobin: 9.4 g/dL — ABNORMAL LOW (ref 12.0–15.0)
Hemoglobin: 9.6 g/dL — ABNORMAL LOW (ref 12.0–15.0)
MCH: 30.7 pg (ref 26.0–34.0)
MCH: 30.7 pg (ref 26.0–34.0)
MCH: 31 pg (ref 26.0–34.0)
MCHC: 33.4 g/dL (ref 30.0–36.0)
MCHC: 33.6 g/dL (ref 30.0–36.0)
MCHC: 34 g/dL (ref 30.0–36.0)
MCHC: 34.3 g/dL (ref 30.0–36.0)
MCHC: 34.3 g/dL (ref 30.0–36.0)
MCV: 90.1 fL (ref 78.0–100.0)
MCV: 90.4 fL (ref 78.0–100.0)
MCV: 90.9 fL (ref 78.0–100.0)
MCV: 91 fL (ref 78.0–100.0)
MCV: 91.8 fL (ref 78.0–100.0)
Platelets: 130 10*3/uL — ABNORMAL LOW (ref 150–400)
RBC: 3.11 MIL/uL — ABNORMAL LOW (ref 3.87–5.11)
RBC: 3.08 MIL/uL — ABNORMAL LOW (ref 3.87–5.11)
RBC: 2.98 MIL/uL — ABNORMAL LOW (ref 3.87–5.11)
RBC: 3.03 MIL/uL — ABNORMAL LOW (ref 3.87–5.11)
RDW: 15.1 % (ref 11.5–15.5)
RDW: 15.3 % (ref 11.5–15.5)
RDW: 15.3 % (ref 11.5–15.5)
WBC: 5.2 10*3/uL (ref 4.0–10.5)
WBC: 5.6 10*3/uL (ref 4.0–10.5)
WBC: 5.8 10*3/uL (ref 4.0–10.5)
WBC: 6.9 10*3/uL (ref 4.0–10.5)

## 2011-04-05 LAB — CARDIAC PANEL(CRET KIN+CKTOT+MB+TROPI)
Relative Index: INVALID (ref 0.0–2.5)
Total CK: 56 U/L (ref 7–177)
Total CK: 58 U/L (ref 7–177)
Troponin I: <0.3 ng/mL (ref <0.30)

## 2011-04-05 LAB — LIPID PANEL: Triglycerides: 118 mg/dL (ref <150)

## 2011-04-05 LAB — PREPARE RBC (CROSSMATCH)

## 2011-04-05 MED ORDER — PANTOPRAZOLE SODIUM 40 MG PO TBEC
40.0000 mg | DELAYED_RELEASE_TABLET | Freq: Two times a day (BID) | ORAL | Status: DC
  Administered 2011-04-05 – 2011-04-07 (×6): 40 mg via ORAL
  Filled 2011-04-05 (×5): qty 1

## 2011-04-05 MED ORDER — ISOSORBIDE MONONITRATE ER 60 MG PO TB24
60.0000 mg | ORAL_TABLET | Freq: Every day | ORAL | Status: DC
  Administered 2011-04-05 – 2011-04-07 (×3): 60 mg via ORAL
  Filled 2011-04-05 (×3): qty 1

## 2011-04-05 MED ORDER — CARVEDILOL PHOSPHATE ER 20 MG PO CP24
20.0000 mg | ORAL_CAPSULE | Freq: Every day | ORAL | Status: DC
  Administered 2011-04-05 – 2011-04-07 (×3): 20 mg via ORAL
  Filled 2011-04-05 (×3): qty 1

## 2011-04-05 NOTE — Progress Notes (Signed)
Please see addendum at end for MD NOTE   Subjective: Pt had moderate BRBPR at night, now mostly clots. Otherwise she feels well but is hungry on the clear liquid diet. No bowel movements overnight, urinating w/o problems. Denies shortness of breath, chest pain, or abdominal pain. Tolerated pRBC transfusion well last evening.  Pt mentions that at home, she wakes up with no edema in her lower extremities but her legs swell by evening. This has been happening for years but has been more noticeable over the past few months. Objective: Vital signs in last 24 hours: Filed Vitals:   04/05/11 0400 04/05/11 1331 04/05/11 1332 04/05/11 1334  BP: 120/54 165/66 149/61 147/56  Pulse: 64 65 71 77  Temp: 96.8 F (36 C) 98 F (36.7 C)    TempSrc: Oral Oral    Resp: 20 18    Height:      Weight:      SpO2: 98% 98%      Intake/Output Summary (Last 24 hours) at 04/05/11 1506 Last data filed at 04/05/11 1300  Gross per 24 hour  Intake   1610 ml  Output      3 ml  Net   1607 ml   Physical Exam: General: Alert, cooperative, no apparent distress HEENT: Normocephalic, moist mucous membranes CV: RRR, grade 2 murmur present, best appreciated over left sternal border at 4th intercostal space Pulm: CTAB, no wheezes, crackles, or rhonchi Abd: Nontender, nondistended, normal BS+ Extremities: warm and well-perfused, no pedal edema Neuro: Alert and oriented x3, CN II-XII grossly intact  Little coagulated blood present in bed pan.  Lab Results: CBC    Component Value Date/Time   WBC 5.6 04/05/2011 1200   RBC 2.98* 04/05/2011 1200   HGB 9.4* 04/05/2011 1200   HCT 27.1* 04/05/2011 1200   PLT 141* 04/05/2011 1200   MCV 90.9 04/05/2011 1200   MCH 31.5 04/05/2011 1200   MCHC 34.7 04/05/2011 1200   RDW 15.2 04/05/2011 1200   LYMPHSABS 1.1 04/23/2010 0539   MONOABS 0.6 04/23/2010 0539   EOSABS 0.1 04/23/2010 0539   BASOSABS 0.0 04/23/2010 0539    CMP     Component Value Date/Time   NA 140 04/04/2011 0803   K 3.5 04/04/2011 0803   CL 105 04/04/2011 0803   CO2 26 04/04/2011 0803   GLUCOSE 145* 04/04/2011 0803   BUN 25* 04/04/2011 0803   CREATININE 0.86 04/04/2011 0803   CALCIUM 8.7 04/04/2011 0803   PROT 6.0 06/20/2007 0210   ALBUMIN 3.5 06/20/2007 0210   AST 36 06/20/2007 0210   ALT 23 06/20/2007 0210   ALKPHOS 54 06/20/2007 0210   BILITOT 0.5 06/20/2007 0210   GFRNONAA 59* 04/04/2011 0803   GFRAA 68* 04/04/2011 0803   Lab Results  Component Value Date   CKTOTAL 58 04/05/2011   CKMB 2.2 04/05/2011   TROPONINI <0.30 04/05/2011    Lipid Panel     Component Value Date/Time   CHOL 100 04/05/2011 0340   TRIG 118 04/05/2011 0340   HDL 38* 04/05/2011 0340   CHOLHDL 2.6 04/05/2011 0340   VLDL 24 04/05/2011 0340   LDLCALC 38 04/05/2011 0340     Medications: I have reviewed the patient's current medications. Scheduled Meds:   . acetaminophen  650 mg Oral Once  . allopurinol  300 mg Oral Q0700  . calcium-vitamin D  1 tablet Oral QPM  . diphenhydrAMINE  25 mg Oral Once  . ferrous sulfate  325 mg Oral Q breakfast  .  levothyroxine  88 mcg Oral QAC breakfast  . linagliptin  5 mg Oral Daily  . pantoprazole  40 mg Oral BID AC  . ranolazine  500 mg Oral Q0700  . rosuvastatin  20 mg Oral QPM  . sertraline  50 mg Oral Q0700  . sodium chloride  3 mL Intravenous Q12H  . DISCONTD: carvedilol  20 mg Oral QPM  . DISCONTD: hydrALAZINE  25 mg Oral QID  . DISCONTD: isosorbide mononitrate  60 mg Oral Q0700  . DISCONTD: pantoprazole (PROTONIX) IV  40 mg Intravenous Q12H   Continuous Infusions:  PRN Meds:.sodium chloride, acetaminophen, acetaminophen, HYDROcodone-acetaminophen, nitroGLYCERIN, ondansetron (ZOFRAN) IV, ondansetron, sodium chloride, zolpidem Assessment/Plan: 78yo female with a past medical history of GI bleed, CHF, HTN DM, hypothyroidism, and CAD on aspirin and Plavix who presented with bright red blood per rectum likely due to diverticular bleed.  She received 1U pRBC last evening but continued to have  BRBPR at night, now passing more coagulated blood. CBCs today show fairly stable hemoglobin.  Primary Problem: 1) GI bleed - Likely due to diverticular bleed. Pt has a hx of prior GI bleed 04/2010 at which time colonoscopy showed two polyps (benign) and diverticulosis in the descending and sigmoid colon. The transfusion threshold in this patient is Hb~10 due to her CHD. Pt's Hg dropped to 8.9 last evening, and she was transfused with 1U pRBC with a post-transfusion Hb of 10.5. She continued to have BRBPR at night, but this transitioned to a smaller volume of more coagulated blood in the AM, which is likely due to residual blood clots in her sigmoid colon and rectum.  Hg in AM and afternoon hovered at 9.4-9.7, and we believe this patient is no longer actively bleeding and therefore decided not to transfuse again. The pt appears to be well and is currently asymptomatic from the blood loss. Had a conversation with GI and decided that if this pt starts actively bleeding again this admission, we should evaluate with a tagged RBC scan and call radiology for embolization depending on the RBC scan results.  This is likely the best course of action if the pt returns with GI bleeding after discharge. At this time we will continue monitoring CBCs q6h and transfuse if Hg drops below current levels. --CBC transitioned to q6h (prior q4h) --monitor orthostatics in sitting/lying position (falls risk) --hold aspirin and Plavix; likely will discontinue Plavix at discharge given risk/benefit of vascular protection vs bleeding --Protonix 40mg  po twice daily before meals --Regular diet as we are not anticipating endoscopic procedures --transfer to stepdown if Hg remains stable and pt does not require further transfusions  2) CHF (congestive heart failure) - Volume status appears at baseline.  Cardiac enzymes x3 normal.  Will continue to restrict fluid intake and monitor daily weights and volume status.  Over the past year, pt  may have had rare episodes of exertional angina that resolve with sublingual nitroglycerin and rest, but she does not report any angina in the last 2 months.  Concern for recently increased lower extremity edema at home (not present on exam this admission). --Cardiology consult --Ranolazine 500mg  po daily --Nitroglycerin 0.4 sublingual PRN chest pain  3) CAD (coronary artery disease) - Pt has a history of CABG and multiple PCIs.  She has not had chest pain in the past 2 months and cardiac enzymes x3 are normal.  We are holding aspirin and Plavix given high risk for further bleeding.  4) Hypertension - Blood pressure adequately controlled this admission.  Home antihypertensive medication discontinued in the setting of GI bleeding as precaution against hypotension. Will resume antihypertensive agents as needed, starting with isosorbide-mononitrate 60mg  po daily as this agent will also be beneficial for angina.  5) Diabetes mellitus - HgA1c 6.1 on 04/04/11. Fasting lipid panel with HDL=38 but otherwise normal.  Will continue monitoring blood glucose. --Continue home linagliptin 5mg  po  6) Hyperlipidemia - Fasting lipid panel with HDL=38 but otherwise normal. Will continue rosuvastatin 40mg  daily.  7) Hypothyroid - TSH 1.34 (normal). Continue home Synthroid 88mg  po daily.  8) Falls precautions - Pt has a history of unsteadiness and falls in the past few years.  She is also at risk for orthostatic hypotension. Will place PT/OT consult once certain GI bleeding has stopped.  9) DVT PPX - SCDs   LOS: 1 day   Perjar, Irina 04/05/2011, 3:06 PM    [facility] RESIDENT NOTE: Patient seen and examined by me. Agree with MS3 note as above, with the following changes: S. Patient states still having occasional small episodes of blood per rectum. Now mostly clots. States is nothing like it was yesterday. Denies increase in baseline orthostasis. Tawana Scale Vitals:   04/05/11 1334  BP: 147/56  Pulse: 77  Temp:     Resp:    General: resting in bed Cardiac: Irregularly irregular rate, no rubs, murmurs or gallops. Telemetry shows frequent PACs causing irregularity Pulm: clear to auscultation bilaterally, moving normal volumes of air Abd: soft, nontender, nondistended, BS present Ext: warm and well perfused, 1+ pedal edema present in the bilateral ankles left greater than right. Neuro: alert and oriented X3, cranial nerves II-XII grossly intact  A/P Ms. Anselm Jungling has stabilized her hemoglobin. It is now been stable for the last 8 hours at 9.4. We have transitioned to q. 6 hours hemoglobins. If her hemoglobin is stable overnight will decrease to twice a day. I spoke with the gastroenterology PA in person. She stated at this point with likely resolution of bleeding and stable hemoglobin they would not repeat colonoscopy. Their suggestion if she rebleeds significantly, is to proceed with tagged RBC scan and concurrently notify  the radiologist for possible embolectomy if RBC scan is positive. As such regular diet was started. We'll continue treatment with twice-daily oral PPI. Cardiology consult placed to determine need for Plavix going forward. Will add back antihypertensives as blood pressure allows.  Anvith Mauriello 5:39 PM 04/05/11

## 2011-04-05 NOTE — Consult Note (Signed)
Reason for Consult: Possibility of stopping Plavix secondary to 2 GI bleeds.   Referring Physician: Internal Medicine Teaching service   Cardiologist: Dr. Laural Benes Buckley is an 78 y.o. female.    Chief Complaint:  Passing bright red blood in stool.  HPI: Patient is a 78 y.o. female with a past medical history for CHF, HTN, DM, Hypothyroidism, CAD on asa and Plavix, she also has a history of GI bleed in 04/2010 that was attributed to polyps, presenting 04/04/11 to [facility] ED with hematochezia that started yesterday morning, when she woke up and felt the need to have a bowel movement, when she proceeded to the toilet, she noted an episode of bright red blood per rectum with some clots in it. Volume was moderate. This prompted the patient to come to the ED for further evaluation. Denies any CP, weakness, SOB or any other complaints.  H/H  9.9/29.3 on admit. Then decreased to 8.9/27.  She has continued with bright blood stools, until this pm, recently had bm that was not bloody.   She has a history of coronary disease with CABG, 1996, with LIMA to LAD, VG to the OM and VG to RCA. She subsequently underwent stenting  Approx. 1 year later to the diag. Last cardiac cath 06/22/2007 with severe native disease, patent vein grafts but atretic LIMA. Low normal LV function by cath at that time.  Last myoview :  06/28/10:  Without  Reversible defects.  Low risk scan.  Last 2D Echo:  05/19/10:Left ventricle: The cavity size was normal. Wall thickness was normal. Systolic function was normal. The estimated ejection fraction was in the range of 50% to 55%. Wall motion was normal; there were no regional wall motion abnormalities. Doppler parameters are consistent with abnormal left ventricular relaxation (grade 1 diastolic dysfunction). - Aortic valve: Trivial regurgitation. - Mitral valve: Calcified annulus. Mild regurgitation. - Pulmonary arteries: PA peak pressure: 37mm Hg (S).   History of diastolic heart  failure, chronic NYHA Class 2-3.   Also PVD  With bilateral renal artery stenosis - s/p Lt. Renal artery PTA and stent. Also stents placed to her Lt. SFA.     Now with 2 GI bleeds, may need to discontinue Plavix. She was on Pletal with the previous bleed, which has been stopped.   Other history as below.  Past Medical History  Diagnosis Date  . CHF (congestive heart failure)   . Hypertension   . Diabetes mellitus   . Blood transfusion   . Anemia   . High cholesterol   . Peripheral vascular disease   . PAD (peripheral artery disease)   . Ischemic cardiomyopathy   . Pneumonia 04/2009    community acquired pneumonia  . Hypothyroidism   . GIB (gastrointestinal bleeding) 2011 & 04/04/11  . GERD (gastroesophageal reflux disease)   . Arthritis   . Gout     Past Surgical History  Procedure Date  . Bladder suspension   . Abdominal hysterectomy ~ 1953  . Appendectomy ~ 1953  . Cataract extraction w/ intraocular lens  implant, bilateral 2000's   . Coronary artery bypass graft 1986    CABG X3  . Coronary angioplasty with stent placement 1986    "2"  . Coronary angioplasty with stent placement ~ 1988    "1"  . Angioplasty / stenting femoral 04/2008    superficial femoral & renal    History reviewed. No pertinent family history. Social History:  reports that she has quit smoking. Her smoking  use included Cigarettes. She has a 2.5 pack-year smoking history. She has never used smokeless tobacco. She reports that she drinks alcohol. She reports that she does not use illicit drugs.  Allergies:  Allergies  Allergen Reactions  . Ampicillin Other (See Comments)    unknown    Medications Prior to Admission  Medication Dose Route Frequency Provider Last Rate Last Dose  . 0.9 %  sodium chloride infusion  250 mL Intravenous PRN Darnelle Maffucci 10 mL/hr at 04/04/11 0900 1,000 mL at 04/04/11 0900  . acetaminophen (TYLENOL) tablet 650 mg  650 mg Oral Q6H PRN Darnelle Maffucci       Or  .  acetaminophen (TYLENOL) suppository 650 mg  650 mg Rectal Q6H PRN Darnelle Maffucci      . acetaminophen (TYLENOL) tablet 650 mg  650 mg Oral Once Quentin Ore, MD   650 mg at 04/04/11 1927  . allopurinol (ZYLOPRIM) tablet 300 mg  300 mg Oral Q0700 Darnelle Maffucci   300 mg at 04/05/11 0645  . calcium-vitamin D (OSCAL WITH D) 500-200 MG-UNIT per tablet 1 tablet  1 tablet Oral QPM Darnelle Maffucci   1 tablet at 04/04/11 1928  . diphenhydrAMINE (BENADRYL) capsule 25 mg  25 mg Oral Once Quentin Ore, MD   25 mg at 04/04/11 1927  . ferrous sulfate tablet 325 mg  325 mg Oral Q breakfast Darnelle Maffucci   325 mg at 04/05/11 0901  . HYDROcodone-acetaminophen (NORCO) 5-325 MG per tablet 1-2 tablet  1-2 tablet Oral Q4H PRN Darnelle Maffucci      . levothyroxine (SYNTHROID, LEVOTHROID) tablet 88 mcg  88 mcg Oral QAC breakfast Luisa Hart Tobbia   88 mcg at 04/05/11 1236  . linagliptin (TRADJENTA) tablet 5 mg  5 mg Oral Daily Darnelle Maffucci   5 mg at 04/05/11 1024  . nitroGLYCERIN (NITROSTAT) SL tablet 0.4 mg  0.4 mg Sublingual Q5 min PRN Darnelle Maffucci      . ondansetron (ZOFRAN) tablet 4 mg  4 mg Oral Q6H PRN Darnelle Maffucci       Or  . ondansetron [facility]) injection 4 mg  4 mg Intravenous Q6H PRN Darnelle Maffucci      . pantoprazole (PROTONIX) EC tablet 40 mg  40 mg Oral BID AC Vernice Jefferson, MD   40 mg at 04/05/11 0901  . ranolazine (RANEXA) 12 hr tablet 500 mg  500 mg Oral Q0700 Darnelle Maffucci   500 mg at 04/05/11 0645  . rosuvastatin (CRESTOR) tablet 20 mg  20 mg Oral QPM Luisa Hart Tobbia   20 mg at 04/04/11 1927  . sertraline (ZOLOFT) tablet 50 mg  50 mg Oral Q0700 Darnelle Maffucci   50 mg at 04/05/11 0918  . sodium chloride 0.9 % injection 3 mL  3 mL Intravenous Q12H Patrick Tobbia   3 mL at 04/05/11 1024  . sodium chloride 0.9 % injection 3 mL  3 mL Intravenous PRN Darnelle Maffucci      . zolpidem (AMBIEN) tablet 5 mg  5 mg Oral QHS PRN Darnelle Maffucci      . DISCONTD: 0.9 %  sodium chloride infusion   Intravenous  Continuous Nicholes Stairs, MD      . DISCONTD: amLODipine (NORVASC) tablet 5 mg  5 mg Oral Daily Darnelle Maffucci   5 mg at 04/04/11 1343  . DISCONTD: carvedilol (COREG CR) 24 hr capsule 20 mg  20 mg Oral QPM Darnelle Maffucci      . DISCONTD: furosemide (LASIX) tablet 20 mg  20 mg Oral Daily Darnelle Maffucci   20 mg at 04/04/11 1341  . DISCONTD: hydrALAZINE (APRESOLINE) tablet 25 mg  25 mg Oral QID Luisa Hart Tobbia   25 mg at 04/04/11 1343  . DISCONTD: isosorbide mononitrate (IMDUR) 24 hr tablet 60 mg  60 mg Oral Q0700 Darnelle Maffucci   60 mg at 04/04/11 1343  . DISCONTD: pantoprazole (PROTONIX) EC tablet 40 mg  40 mg Oral Q1200 Patrick Tobbia   40 mg at 04/04/11 1342  . DISCONTD: pantoprazole (PROTONIX) injection 40 mg  40 mg Intravenous Q12H Quentin Ore, MD   40 mg at 04/04/11 2301   No current outpatient prescriptions on file as of 04/05/2011.    Results for orders placed during the hospital encounter of 04/04/11 (from the past 48 hour(s))  CBC     Status: Abnormal   Collection Time   04/04/11  8:03 AM      Component Value Range Comment   WBC 6.1  4.0 - 10.5 (K/uL)    RBC 3.16 (*) 3.87 - 5.11 (MIL/uL)    Hemoglobin 9.9 (*) 12.0 - 15.0 (g/dL)    HCT 46.9 (*) 62.9 - 46.0 (%)    MCV 92.7  78.0 - 100.0 (fL)    MCH 31.3  26.0 - 34.0 (pg)    MCHC 33.8  30.0 - 36.0 (g/dL)    RDW 52.8  41.3 - 24.4 (%)    Platelets 169  150 - 400 (K/uL)   BASIC METABOLIC PANEL     Status: Abnormal   Collection Time   04/04/11  8:03 AM      Component Value Range Comment   Sodium 140  135 - 145 (mEq/L)    Potassium 3.5  3.5 - 5.1 (mEq/L)    Chloride 105  96 - 112 (mEq/L)    CO2 26  19 - 32 (mEq/L)    Glucose, Bld 145 (*) 70 - 99 (mg/dL)    BUN 25 (*) 6 - 23 (mg/dL)    Creatinine, Ser 0.10  0.50 - 1.10 (mg/dL)    Calcium 8.7  8.4 - 10.5 (mg/dL)    GFR calc non Af Amer 59 (*) >90 (mL/min)    GFR calc Af Amer 68 (*) >90 (mL/min)   OCCULT BLOOD, POC DEVICE     Status: Normal   Collection Time   04/04/11   8:23 AM      Component Value Range Comment   Fecal Occult Bld POSITIVE     TYPE AND SCREEN     Status: Normal (Preliminary result)   Collection Time   04/04/11  8:53 AM      Component Value Range Comment   ABO/RH(D) O POS      Antibody Screen NEG      Sample Expiration 04/07/2011      Unit Number [id number]      Blood Component Type RED CELLS,LR      Unit division 00      Status of Unit ISSUED,FINAL      Transfusion Status OK TO TRANSFUSE      Crossmatch Result Compatible      Unit Number [id number]      Blood Component Type RED CELLS,LR      Unit division 00      Status of Unit ALLOCATED      Transfusion Status OK TO TRANSFUSE      Crossmatch Result Compatible      Unit Number [id number]      Blood Component Type  RED CELLS,LR      Unit division 00      Status of Unit ALLOCATED      Transfusion Status OK TO TRANSFUSE      Crossmatch Result Compatible     TSH     Status: Normal   Collection Time   04/04/11  8:58 AM      Component Value Range Comment   TSH 1.340  0.350 - 4.500 (uIU/mL)   GLUCOSE, CAPILLARY     Status: Abnormal   Collection Time   04/04/11 11:17 AM      Component Value Range Comment   Glucose-Capillary 136 (*) 70 - 99 (mg/dL)   HEMOGLOBIN Z6X     Status: Abnormal   Collection Time   04/04/11 11:54 AM      Component Value Range Comment   Hemoglobin A1C 6.1 (*) <5.7 (%)    Mean Plasma Glucose 128 (*) <117 (mg/dL)   CBC     Status: Abnormal   Collection Time   04/04/11 11:54 AM      Component Value Range Comment   WBC 5.7  4.0 - 10.5 (K/uL)    RBC 3.10 (*) 3.87 - 5.11 (MIL/uL)    Hemoglobin 9.7 (*) 12.0 - 15.0 (g/dL)    HCT 09.6 (*) 04.5 - 46.0 (%)    MCV 92.9  78.0 - 100.0 (fL)    MCH 31.3  26.0 - 34.0 (pg)    MCHC 33.7  30.0 - 36.0 (g/dL)    RDW 40.9  81.1 - 91.4 (%)    Platelets 169  150 - 400 (K/uL)   PROTIME-INR     Status: Normal   Collection Time   04/04/11  1:42 PM      Component Value Range Comment   Prothrombin Time 14.4  11.6 - 15.2 (seconds)      INR 1.10  0.00 - 1.49    APTT     Status: Normal   Collection Time   04/04/11  1:42 PM      Component Value Range Comment   aPTT 28  24 - 37 (seconds)   CBC     Status: Abnormal   Collection Time   04/04/11  4:06 PM      Component Value Range Comment   WBC 5.9  4.0 - 10.5 (K/uL)    RBC 2.90 (*) 3.87 - 5.11 (MIL/uL)    Hemoglobin 8.9 (*) 12.0 - 15.0 (g/dL)    HCT 78.2 (*) 95.6 - 46.0 (%)    MCV 93.1  78.0 - 100.0 (fL)    MCH 30.7  26.0 - 34.0 (pg)    MCHC 33.0  30.0 - 36.0 (g/dL)    RDW 21.3  08.6 - 57.8 (%)    Platelets 167  150 - 400 (K/uL)   CARDIAC PANEL(CRET KIN+CKTOT+MB+TROPI)     Status: Normal   Collection Time   04/04/11  4:06 PM      Component Value Range Comment   Total CK 52  7 - 177 (U/L)    CK, MB 1.8  0.3 - 4.0 (ng/mL)    Troponin I <0.30  <0.30 (ng/mL)    Relative Index RELATIVE INDEX IS INVALID  0.0 - 2.5    GLUCOSE, CAPILLARY     Status: Abnormal   Collection Time   04/04/11  5:04 PM      Component Value Range Comment   Glucose-Capillary 133 (*) 70 - 99 (mg/dL)    Comment 1 Notify RN  PREPARE RBC (CROSSMATCH)     Status: Normal   Collection Time   04/04/11  5:30 PM      Component Value Range Comment   Order Confirmation ORDER PROCESSED BY BLOOD BANK     GLUCOSE, CAPILLARY     Status: Abnormal   Collection Time   04/04/11  8:08 PM      Component Value Range Comment   Glucose-Capillary 107 (*) 70 - 99 (mg/dL)    Comment 1 Notify RN     CARDIAC PANEL(CRET KIN+CKTOT+MB+TROPI)     Status: Normal   Collection Time   04/05/11 12:03 AM      Component Value Range Comment   Total CK 56  7 - 177 (U/L)    CK, MB 1.9  0.3 - 4.0 (ng/mL)    Troponin I <0.30  <0.30 (ng/mL)    Relative Index RELATIVE INDEX IS INVALID  0.0 - 2.5    CBC     Status: Abnormal   Collection Time   04/05/11 12:05 AM      Component Value Range Comment   WBC 6.9  4.0 - 10.5 (K/uL)    RBC 3.42 (*) 3.87 - 5.11 (MIL/uL)    Hemoglobin 10.5 (*) 12.0 - 15.0 (g/dL)    HCT 16.1 (*) 09.6 - 46.0  (%)    MCV 91.8  78.0 - 100.0 (fL)    MCH 30.7  26.0 - 34.0 (pg)    MCHC 33.4  30.0 - 36.0 (g/dL)    RDW 04.5  40.9 - 81.1 (%)    Platelets 166  150 - 400 (K/uL)   CBC     Status: Abnormal   Collection Time   04/05/11  3:40 AM      Component Value Range Comment   WBC 5.8  4.0 - 10.5 (K/uL)    RBC 3.11 (*) 3.87 - 5.11 (MIL/uL)    Hemoglobin 9.7 (*) 12.0 - 15.0 (g/dL)    HCT 91.4 (*) 78.2 - 46.0 (%)    MCV 91.0  78.0 - 100.0 (fL)    MCH 31.2  26.0 - 34.0 (pg)    MCHC 34.3  30.0 - 36.0 (g/dL)    RDW 95.6  21.3 - 08.6 (%)    Platelets 130 (*) 150 - 400 (K/uL)   LIPID PANEL     Status: Abnormal   Collection Time   04/05/11  3:40 AM      Component Value Range Comment   Cholesterol 100  0 - 200 (mg/dL)    Triglycerides 578  <150 (mg/dL)    HDL 38 (*) >46 (mg/dL)    Total CHOL/HDL Ratio 2.6      VLDL 24  0 - 40 (mg/dL)    LDL Cholesterol 38  0 - 99 (mg/dL)   CARDIAC PANEL(CRET KIN+CKTOT+MB+TROPI)     Status: Normal   Collection Time   04/05/11  7:54 AM      Component Value Range Comment   Total CK 58  7 - 177 (U/L)    CK, MB 2.2  0.3 - 4.0 (ng/mL)    Troponin I <0.30  <0.30 (ng/mL)    Relative Index RELATIVE INDEX IS INVALID  0.0 - 2.5    CBC     Status: Abnormal   Collection Time   04/05/11  7:55 AM      Component Value Range Comment   WBC 5.2  4.0 - 10.5 (K/uL)    RBC 3.08 (*) 3.87 - 5.11 (MIL/uL)  Hemoglobin 9.4 (*) 12.0 - 15.0 (g/dL)    HCT 40.9 (*) 81.1 - 46.0 (%)    MCV 90.9  78.0 - 100.0 (fL)    MCH 30.5  26.0 - 34.0 (pg)    MCHC 33.6  30.0 - 36.0 (g/dL)    RDW 91.4  78.2 - 95.6 (%)    Platelets 148 (*) 150 - 400 (K/uL)   GLUCOSE, CAPILLARY     Status: Abnormal   Collection Time   04/05/11  7:55 AM      Component Value Range Comment   Glucose-Capillary 137 (*) 70 - 99 (mg/dL)    Comment 1 Notify RN     PREPARE RBC (CROSSMATCH)     Status: Normal   Collection Time   04/05/11  8:30 AM      Component Value Range Comment   Order Confirmation ORDER PROCESSED BY BLOOD  BANK     GLUCOSE, CAPILLARY     Status: Abnormal   Collection Time   04/05/11 11:47 AM      Component Value Range Comment   Glucose-Capillary 106 (*) 70 - 99 (mg/dL)    Comment 1 Notify RN     CBC     Status: Abnormal   Collection Time   04/05/11 12:00 PM      Component Value Range Comment   WBC 5.6  4.0 - 10.5 (K/uL)    RBC 2.98 (*) 3.87 - 5.11 (MIL/uL)    Hemoglobin 9.4 (*) 12.0 - 15.0 (g/dL)    HCT 21.3 (*) 08.6 - 46.0 (%)    MCV 90.9  78.0 - 100.0 (fL)    MCH 31.5  26.0 - 34.0 (pg)    MCHC 34.7  30.0 - 36.0 (g/dL)    RDW 57.8  46.9 - 62.9 (%)    Platelets 141 (*) 150 - 400 (K/uL)    No results found.  ROS: General:No colds or fevers Skin:no rashes or ulcers HEENT:no blurred vision CV:no chest pain, no palpatations PUL:no change from her chronic SOB BM:WUXLKGM blood since 04/04/11 GU:no hematuria Neuro:no syncope, no lightheadedness Endocrine:diabetic but stable. MS: develops edema by the end of the day of lower ext.  No claudication symptoms.  Blood pressure 147/56, pulse 77, temperature 98 F (36.7 C), temperature source Oral, resp. rate 18, height 4\' 11"  (1.499 m), weight 62.6 kg (138 lb 0.1 oz), SpO2 98.00%. PE: General:A&O X 3,  pleasant affect, NAD Skin:w&d, brisk capillary refill HEENT:normocephalic, sclera clear Neck:supple, no JVD, no bruits Heart:S1S2, rrr, no obvious murmur Lungs:clear without rales, rhonchi, or wheezes. Abd:+BS, soft. Neuro: A&O X 3 MAE, follows commands Ext.: no edema, pedal pulses present.    Assessment/Plan Patient Active Problem List  Diagnoses  . CHF (congestive heart failure)  . CAD (coronary artery disease)  . HTN (hypertension)  . DM (diabetes mellitus)  . Hypothyroid  . GI bleed   PLAN: MD to see concerning stopping the plavix.  INGOLD,Alison R 04/05/2011, 3:05 PM     I have seen and examined the patient along with [facility] R, NP.  I have reviewed the chart, notes and new data.  I agree with NP's note.  Key new  complaints: Bleeding seems to have stopped. Had a small normal BM. Key examination changes: slightly pale. Key new findings / data: Hgb still drifting lower slowly  PLAN: At this point risk of clopidogrel worsened bleeding greatly outweighs potential benefit (no recent ACS or revascularization procedure). DC clopidogrel. Resume ASA 81 mg daily at discharge. Restart  carvedilol - risk of rebound angina/HTN.  Watch for CHF - resume nitrates, then hydralazine as BP allows.  Alison Fair, MD, [facility] [phone] 04/05/2011, 6:10 PM

## 2011-04-05 NOTE — Progress Notes (Signed)
Internal Medicine Teaching Service Attending Note Date: 04/05/2011  Patient name: Alison Buckley  Medical record number: [id number]  Date of birth: 12/14/1922    This patient has been seen and discussed with the house staff. Please see their note for complete details. I concur with their findings with the following additions/corrections: Alison Buckley looks good. Wasn't able to rest well. Still with BRBPR but now not as bright red, some clots, and seems to be slowing down. She required 1 unit PRBC and HgB responded appropriately from 8.9 to 10.5 but drifting down to 9.4. Will cont to follow and if decreases more, transfuse again. Likely divertic bleed. Await GI eval.  No CP. No SOB. Vitals stable.   Kamalei Roeder 04/05/2011, 3:44 PM

## 2011-04-06 LAB — CBC
HCT: 26.9 % — ABNORMAL LOW (ref 36.0–46.0)
HCT: 24.8 % — ABNORMAL LOW (ref 36.0–46.0)
Hemoglobin: 8.9 g/dL — ABNORMAL LOW (ref 12.0–15.0)
Hemoglobin: 9.1 g/dL — ABNORMAL LOW (ref 12.0–15.0)
MCH: 31.2 pg (ref 26.0–34.0)
MCH: 30.7 pg (ref 26.0–34.0)
MCHC: 34.4 g/dL (ref 30.0–36.0)
MCHC: 33.8 g/dL (ref 30.0–36.0)
MCHC: 33.5 g/dL (ref 30.0–36.0)
MCV: 90.9 fL (ref 78.0–100.0)
MCV: 91.2 fL (ref 78.0–100.0)
Platelets: 142 10*3/uL — ABNORMAL LOW (ref 150–400)
Platelets: 148 10*3/uL — ABNORMAL LOW (ref 150–400)
RDW: 15.3 % (ref 11.5–15.5)
RDW: 15.4 % (ref 11.5–15.5)
WBC: 6.2 10*3/uL (ref 4.0–10.5)

## 2011-04-06 LAB — BASIC METABOLIC PANEL
BUN: 21 mg/dL (ref 6–23)
Calcium: 8.5 mg/dL (ref 8.4–10.5)
GFR calc Af Amer: 69 mL/min — ABNORMAL LOW (ref >90)
GFR calc non Af Amer: 60 mL/min — ABNORMAL LOW (ref >90)
Glucose, Bld: 101 mg/dL — ABNORMAL HIGH (ref 70–99)
Sodium: 140 mEq/L (ref 135–145)

## 2011-04-06 LAB — GLUCOSE, CAPILLARY: Glucose-Capillary: 108 mg/dL — ABNORMAL HIGH (ref 70–99)

## 2011-04-06 MED ORDER — HYDRALAZINE HCL 25 MG PO TABS
12.5000 mg | ORAL_TABLET | Freq: Three times a day (TID) | ORAL | Status: DC
  Administered 2011-04-06 – 2011-04-07 (×3): 12.5 mg via ORAL
  Filled 2011-04-06 (×6): qty 0.5

## 2011-04-06 MED ORDER — OMEPRAZOLE 20 MG PO CPDR: 40.0000 mg | DELAYED_RELEASE_CAPSULE | ORAL | Status: DC

## 2011-04-06 MED ORDER — HYDRALAZINE HCL 25 MG PO TABS: 12.5000 mg | ORAL_TABLET | Freq: Three times a day (TID) | ORAL | Status: DC

## 2011-04-06 NOTE — Progress Notes (Signed)
[facility]  Subjective:   Objective: Vital signs in last 24 hours: Temp:  [97.9 F (36.6 C)-98.4 F (36.9 C)] 98.4 F (36.9 C) (01/12 0500) Pulse Rate:  [65-77] 68  (01/12 1032) Resp:  [17-18] 18  (01/12 0500) BP: (147-178)/(56-68) 178/68 mmHg (01/12 1032) SpO2:  [96 %-98 %] 96 % (01/12 0500) Weight:  [63.56 kg (140 lb 2 oz)] 63.56 kg (140 lb 2 oz) (01/12 0500) Last BM Date: 04/06/11  Intake/Output from previous day: 01/11 0701 - 01/12 0700 In: 1920 [P.O.:1920] Out: 401 [Urine:400; Stool:1] Intake/Output this shift: Total I/O In: 3 [I.V.:3] Out: -   Medications Current Facility-Administered Medications  Medication Dose Route Frequency Provider Last Rate Last Dose  . 0.9 %  sodium chloride infusion  250 mL Intravenous PRN Darnelle Maffucci 10 mL/hr at 04/04/11 0900 1,000 mL at 04/04/11 0900  . acetaminophen (TYLENOL) tablet 650 mg  650 mg Oral Q6H PRN Darnelle Maffucci       Or  . acetaminophen (TYLENOL) suppository 650 mg  650 mg Rectal Q6H PRN Darnelle Maffucci      . allopurinol (ZYLOPRIM) tablet 300 mg  300 mg Oral Q0700 Darnelle Maffucci   300 mg at 04/06/11 0622  . calcium-vitamin D (OSCAL WITH D) 500-200 MG-UNIT per tablet 1 tablet  1 tablet Oral QPM Darnelle Maffucci   1 tablet at 04/05/11 1714  . carvedilol (COREG CR) 24 hr capsule 20 mg  20 mg Oral Daily Mihai Croitoru, MD   20 mg at 04/06/11 1031  . ferrous sulfate tablet 325 mg  325 mg Oral Q breakfast Darnelle Maffucci   325 mg at 04/06/11 0803  . HYDROcodone-acetaminophen (NORCO) 5-325 MG per tablet 1-2 tablet  1-2 tablet Oral Q4H PRN Darnelle Maffucci      . isosorbide mononitrate (IMDUR) 24 hr tablet 60 mg  60 mg Oral Daily Mihai Croitoru, MD   60 mg at 04/06/11 1030  . levothyroxine (SYNTHROID, LEVOTHROID) tablet 88 mcg  88 mcg Oral QAC breakfast Darnelle Maffucci   88 mcg at 04/06/11 0803  . linagliptin (TRADJENTA) tablet 5 mg  5 mg Oral Daily Darnelle Maffucci   5 mg at 04/06/11 1030  . nitroGLYCERIN  (NITROSTAT) SL tablet 0.4 mg  0.4 mg Sublingual Q5 min PRN Darnelle Maffucci      . ondansetron (ZOFRAN) tablet 4 mg  4 mg Oral Q6H PRN Darnelle Maffucci       Or  . ondansetron [facility]) injection 4 mg  4 mg Intravenous Q6H PRN Darnelle Maffucci      . pantoprazole (PROTONIX) EC tablet 40 mg  40 mg Oral BID AC Vernice Jefferson, MD   40 mg at 04/06/11 0630  . ranolazine (RANEXA) 12 hr tablet 500 mg  500 mg Oral Q0700 Darnelle Maffucci   500 mg at 04/06/11 1610  . rosuvastatin (CRESTOR) tablet 20 mg  20 mg Oral QPM Patrick Tobbia   20 mg at 04/05/11 1714  . sertraline (ZOLOFT) tablet 50 mg  50 mg Oral Q0700 Darnelle Maffucci   50 mg at 04/06/11 9604  . sodium chloride 0.9 % injection 3 mL  3 mL Intravenous Q12H Patrick Tobbia   3 mL at 04/06/11 1030  . sodium chloride 0.9 % injection 3 mL  3 mL Intravenous PRN Darnelle Maffucci      . zolpidem (AMBIEN) tablet 5 mg  5 mg Oral QHS PRN Darnelle Maffucci        PE: General: [id number] No JVD No  rales Cardio:RR 1/6 SEM BS+ No edema    Lab Results:   Basename 04/06/11 1021 04/06/11 0630 04/05/11 2228  WBC 6.2 5.2 5.8  HGB 9.1* 8.9* 9.6*  HCT 26.9* 25.9* 28.2*  PLT 148* 142* 149*   BMET  Basename 04/06/11 0630 04/04/11 0803  NA 140 140  K 3.9 3.5  CL 108 105  CO2 24 26  GLUCOSE 101* 145*  BUN 21 25*  CREATININE 0.85 0.86  CALCIUM 8.5 8.7   PT/INR  Basename 04/04/11 1342  LABPROT 14.4  INR 1.10   Cholesterol  Basename 04/05/11 0340  CHOL 100      Assessment/Plan  Principal Problem:  *GI bleed Active Problems:  CHF (congestive heart failure)  CAD (coronary artery disease), CABG 1996, stent to Diag approx 1997  HTN (hypertension)  DM (diabetes mellitus)  Hypothyroid  PAD (peripheral artery disease), stents to L. renal art. and stents to LSFA  Plan:  Plavix DCd.  BP elevated to 178/68.    Needs better control.  Hydralazine 12.5 Q8   LOS: 2 days    HAGER,BRYAN W 04/06/2011 11:05 AM    Patient seen and examined. Agree with  assessment and plan. No further BRBPR, but passed dark clot in stool today. BP elevated; will resume hydralazine at 12.8 q 8 hrs and titrate. Now off Plavix.   Lennette Bihari, MD, [facility] 04/06/2011 11:09 AM

## 2011-04-06 NOTE — Progress Notes (Signed)
Subjective: She states that she had one bowel movement last night with clots and one bowel movement this morning with some clots but no bright red blood per rectum.  She denies any lightheadedness when she is up and out of bed to chair.  She feels great and wanted to go home today.    Objective: Vital signs in last 24 hours: Filed Vitals:   04/05/11 1334 04/05/11 2100 04/06/11 0500 04/06/11 1032  BP: 147/56 148/67 147/61 178/68  Pulse: 77 71 72 68  Temp:  97.9 F (36.6 C) 98.4 F (36.9 C)   TempSrc:  Oral Oral   Resp:  17 18   Height:      Weight:   140 lb 2 oz (63.56 kg)   SpO2:  97% 96%    Weight change: 2 lb 1.9 oz (0.96 kg)  Intake/Output Summary (Last 24 hours) at 04/06/11 1133 Last data filed at 04/06/11 1030  Gross per 24 hour  Intake   1323 ml  Output    401 ml  Net    922 ml   Physical Exam: General: Alert, cooperative, no apparent distress  HEENT: Normocephalic, moist mucous membranes  CV: RRR, grade 2 murmur present, best appreciated over left sternal border at 4th intercostal space  Pulm: CTAB, no wheezes, crackles, or rhonchi  Abd: Nontender, nondistended, normal BS+  Extremities: warm and well-perfused, no pedal edema  Neuro: Alert and oriented x3, CN II-XII grossly intact  Lab Results: Basic Metabolic Panel:  Lab 04/06/11 1610 04/04/11 0803  NA 140 140  K 3.9 3.5  CL 108 105  CO2 24 26  GLUCOSE 101* 145*  BUN 21 25*  CREATININE 0.85 0.86  CALCIUM 8.5 8.7  MG -- --  PHOS -- --   CBC:  Lab 04/06/11 1021 04/06/11 0630  WBC 6.2 5.2  NEUTROABS -- --  HGB 9.1* 8.9*  HCT 26.9* 25.9*  MCV 90.9 90.9  PLT 148* 142*   Cardiac Enzymes:  Lab 04/05/11 0754 04/05/11 0003 04/04/11 1606  CKTOTAL 58 56 52  CKMB 2.2 1.9 1.8  CKMBINDEX -- -- --  TROPONINI <0.30 <0.30 <0.30     Lab 04/06/11 0740 04/05/11 2207 04/05/11 1710 04/05/11 1147 04/05/11 0755 04/04/11 2008  GLUCAP 108* 110* 115* 106* 137* 107*   Hemoglobin A1C:  Lab 04/04/11 1154  HGBA1C  6.1*   Fasting Lipid Panel:  Lab 04/05/11 0340  CHOL 100  HDL 38*  LDLCALC 38  TRIG 960  CHOLHDL 2.6  LDLDIRECT --   Thyroid Function Tests:  Lab 04/04/11 0858  TSH 1.340  T4TOTAL --  FREET4 --  T3FREE --  THYROIDAB --   Coagulation:  Lab 04/04/11 1342  LABPROT 14.4  INR 1.10    Micro Results: No results found for this or any previous visit (from the past 240 hour(s)). Studies/Results: No results found. Medications: Scheduled Meds:   . allopurinol  300 mg Oral Q0700  . calcium-vitamin D  1 tablet Oral QPM  . carvedilol  20 mg Oral Daily  . ferrous sulfate  325 mg Oral Q breakfast  . hydrALAZINE  12.5 mg Oral Q8H  . isosorbide mononitrate  60 mg Oral Daily  . levothyroxine  88 mcg Oral QAC breakfast  . linagliptin  5 mg Oral Daily  . pantoprazole  40 mg Oral BID AC  . ranolazine  500 mg Oral Q0700  . rosuvastatin  20 mg Oral QPM  . sertraline  50 mg Oral Q0700  . sodium  chloride  3 mL Intravenous Q12H   Continuous Infusions:  PRN Meds:.sodium chloride, acetaminophen, acetaminophen, HYDROcodone-acetaminophen, nitroGLYCERIN, ondansetron (ZOFRAN) IV, ondansetron, sodium chloride, zolpidem  Assessment/Plan: 1) GI bleed -Likely diverticular bleed. No active bleeding beside clots and she denies any lightheadedness when she was sitting up in chair.  Hb is 8.9 this morning.   --CBC transitioned to q12hr --Protonix 40mg  po twice daily before meals   2) CHF (congestive heart failure) -  --Ranolazine 500mg  po daily  --Nitroglycerin 0.4 sublingual PRN chest pain  3) CAD (coronary artery disease) - Pt has a history of CABG and multiple PCIs. She has not had chest pain in the past 2 months and cardiac enzymes x3 are normal. We are holding aspirin and Plavix given high risk for further bleeding.  Will consider resume ASA at discharge 4) Hypertension - Not well controlled BP 178/68  -Continue Isosorbide-mononitrate 60mg  po daily  -Start  Hydralazine 12.5mg  po q8h per  cardiology 5) Diabetes mellitus - HgA1c 6.1 on 04/04/11. Fasting lipid panel with HDL=38 but otherwise normal. Will continue monitoring blood glucose.  --Continue home linagliptin 5mg  po  6) Hyperlipidemia - Fasting lipid panel with HDL=38 but otherwise normal. Will continue rosuvastatin 40mg  daily.  7) Hypothyroid - TSH 1.34 (normal). Continue home Synthroid 88mg  po daily.  8) Falls precautions - Pt has a history of unsteadiness and falls in the past few years. She is also at risk for orthostatic hypotension. PT/OT 9) DVT PPX - SCDs   Dispo: likely home tomorrow if Hb remains stable   LOS: 2 days   Tenna Lacko 04/06/2011, 11:33 AM

## 2011-04-06 NOTE — Discharge Summary (Signed)
Internal Medicine Teaching [facility] Discharge Note  Name: Alison Buckley MRN: [id number] DOB: 07/09/1922 78 y.o.  Date of Admission: 04/04/2011  7:39 AM Date of Discharge: 04/06/2011 Attending Physician: Blanch Media  Discharge Diagnosis: Principal Problem:  *GI bleed Active Problems:  CHF (congestive heart failure)  CAD (coronary artery disease), CABG 1996, stent to Diag approx 1997  HTN (hypertension)  DM (diabetes mellitus)  Hypothyroid  PAD (peripheral artery disease), stents to L. renal art. and stents to LSFA   Discharge Medications: Current Discharge Medication List    CONTINUE these medications which have CHANGED   Details  hydrALAZINE (APRESOLINE) 25 MG tablet Take 0.5 tablets (12.5 mg total) by mouth 3 (three) times daily. Qty: 90 tablet, Refills: 0    omeprazole (PRILOSEC) 20 MG capsule Take 2 capsules (40 mg total) by mouth every morning. Qty: 30 capsule, Refills: 1      CONTINUE these medications which have NOT CHANGED   Details  allopurinol (ZYLOPRIM) 300 MG tablet Take 300 mg by mouth every morning.    amLODipine (NORVASC) 10 MG tablet Take 5 mg by mouth daily.    aspirin EC 81 MG tablet Take 81 mg by mouth every morning.    calcium-vitamin D (OSCAL WITH D) 500-200 MG-UNIT per tablet Take 1 tablet by mouth every evening.    carvedilol (COREG CR) 20 MG 24 hr capsule Take 20 mg by mouth every evening.    ferrous sulfate 325 (65 FE) MG tablet Take 325 mg by mouth daily with breakfast.    furosemide (LASIX) 40 MG tablet Take 20 mg by mouth daily.    isosorbide mononitrate (IMDUR) 60 MG 24 hr tablet Take 60 mg by mouth every morning.    levothyroxine (SYNTHROID, LEVOTHROID) 88 MCG tablet Take 88 mcg by mouth every morning.    nitroGLYCERIN (NITROSTAT) 0.4 MG SL tablet Place 0.4 mg under the tongue every 5 (five) minutes as needed. For chest pain    ranolazine (RANEXA) 500 MG 12 hr tablet Take 500 mg by mouth every morning.    rosuvastatin  (CRESTOR) 20 MG tablet Take 20 mg by mouth every evening.    sertraline (ZOLOFT) 50 MG tablet Take 50 mg by mouth every morning.    sitaGLIPtin (JANUVIA) 50 MG tablet Take 100 mg by mouth daily.      STOP taking these medications     clopidogrel (PLAVIX) 75 MG tablet         Disposition and follow-up:   Ms.Alison Buckley was [facility] from [name] in stable condition.    Follow-up Appointments: Follow-up Information    Follow up with [facility] TOM, MD in 5 days.          Consultations: Treatment Team:  Thurmon Fair, MD  Admission HPI: Patient is a 78 y.o. female with a past medical history for CHF, HTN, DM, Hypothyroidism, CAD on asa and Plavix, she also has a history of GI bleed in 04/2010 that was attributed to polyps, presenting today to [name] with hematochezia that started this morning, when she woke up and felt the need to have a bowel movement, when she proceeded to the toilet, she noted an episode of bright red blood per rectum with some clots in it. Volume was moderate. This prompted the patient to come to the ED for further evaluation. Denies any CP, weakness, SOB or any other complaints.    Hospital Course by problem list: 1) GI bleed - Likely due to diverticular bleed. Pt has  a hx of prior GI bleed 04/2010 at which time colonoscopy showed two polyps (benign) and diverticulosis in the descending and sigmoid colon. The transfusion threshold in this patient is Hb~(9-10) due to her CHD; therefore, she did receive 1 unit of PRBC when her Hb was 8.9 on day 2 of admission and her post-transfusion Hb was 10.5, CBCs were monitored q4h, then q6h, then q12h. She continued to have BRBPR at night, but this transitioned to a smaller volume of more coagulated blood in the AM, which is likely due to residual blood clots in her sigmoid colon and rectum. Her Hb stabilizes around 9 and did not have any other episodes of bright red blood per rectum; therefore, no  further transfusion required.  We spoke with GI and decided that if this pt starts actively bleeding again this admission, we should evaluate with a tagged RBC scan and call radiology for embolization depending on the RBC scan results. This is likely the best course of action if the pt returns with GI bleeding after discharge. Aspirin and Plavix were held given her GI bleed and anti-hypertensive meds were held at first to prevent hypotension in setting of GI bleed.  Patient also received IV Protonix initially and then was transitioned to PO.   2) CHF (congestive heart failure) - Volume status appears at baseline. Cardiac enzymes x3 normal. Will continue to restrict fluid intake and monitor daily weights and volume status. Over the past year, pt may have had rare episodes of exertional angina that resolve with sublingual nitroglycerin and rest, but she does not report any angina in the last 2 months. Concern for recently increased lower extremity edema at home (not present on exam this admission). Cardiology was consulted and Dr. Royann Shivers recommended that the risk of clopidogrel worsened bleeding greatly outweighs potential benefit (no recent ACS or revascularization procedure), therefore; clopidogrel was discontinued.  Patient is to resume ASA 81 mg daily at discharge.  Once her Hb was stable, we restarted carvedilol - risk of rebound angina/HTN and Nitrates, and hydralazine since her BP could tolerate.   3) CAD (coronary artery disease) - Pt has a history of CABG and multiple PCIs. She has not had chest pain in the past 2 months and cardiac enzymes x3 are normal. Plavix was discontinued on this admission and will resume ASA on discharge.  4) Hypertension - Blood pressure adequately controlled this admission. Home antihypertensive medication discontinued in the setting of GI bleeding as precaution against hypotension. We did resume antihypertensive agents as her BP continues to elevate up to 170/60's.  We  resumed Coreg 20mg  qd, isosorbide mononitrate 60mg  qd, Lasix 40mg  qd, Norvasc 10mg , Hydralazine 12.5mg  tid (decreased dose from 25mg  qid)  5) Diabetes mellitus - HgA1c 6.1 on 04/04/11. Fasting lipid panel with HDL=38 but otherwise normal. Will continue monitoring blood glucose.  --Continue home linagliptin 5mg  po   6) Hyperlipidemia - Fasting lipid panel with HDL=38 but otherwise normal. Will continue rosuvastatin 40mg  daily.   7) Hypothyroid - TSH 1.34 (normal). Continue home Synthroid 88mg  po daily.   8) Falls precautions - Pt has a history of unsteadiness and falls in the past few years. She is also at risk for orthostatic hypotension. PT/OT was consulted for safety.   9) DVT PPX - SCDs in setting of GI bleed     Discharge Vitals:  BP 156/69  Pulse 68  Temp(Src) 97.9 F (36.6 C) (Oral)  Resp 20  Ht 4\' 11"  (1.499 m)  Wt 140 lb 2  oz (63.56 kg)  BMI 28.30 kg/m2  SpO2 97%  Discharge Labs:  Results for orders placed during the hospital encounter of 04/04/11 (from the past 24 hour(s))  CBC     Status: Abnormal   Collection Time   04/05/11  4:35 PM      Component Value Range   WBC 5.2  4.0 - 10.5 (K/uL)   RBC 3.03 (*) 3.87 - 5.11 (MIL/uL)   Hemoglobin 9.4 (*) 12.0 - 15.0 (g/dL)   HCT 16.1 (*) 09.6 - 46.0 (%)   MCV 90.4  78.0 - 100.0 (fL)   MCH 31.0  26.0 - 34.0 (pg)   MCHC 34.3  30.0 - 36.0 (g/dL)   RDW 04.5  40.9 - 81.1 (%)   Platelets 145 (*) 150 - 400 (K/uL)  GLUCOSE, CAPILLARY     Status: Abnormal   Collection Time   04/05/11  5:10 PM      Component Value Range   Glucose-Capillary 115 (*) 70 - 99 (mg/dL)   Comment 1 Notify RN    GLUCOSE, CAPILLARY     Status: Abnormal   Collection Time   04/05/11 10:07 PM      Component Value Range   Glucose-Capillary 110 (*) 70 - 99 (mg/dL)  CBC     Status: Abnormal   Collection Time   04/05/11 10:28 PM      Component Value Range   WBC 5.8  4.0 - 10.5 (K/uL)   RBC 3.13 (*) 3.87 - 5.11 (MIL/uL)   Hemoglobin 9.6 (*) 12.0 - 15.0  (g/dL)   HCT 91.4 (*) 78.2 - 46.0 (%)   MCV 90.1  78.0 - 100.0 (fL)   MCH 30.7  26.0 - 34.0 (pg)   MCHC 34.0  30.0 - 36.0 (g/dL)   RDW 95.6  21.3 - 08.6 (%)   Platelets 149 (*) 150 - 400 (K/uL)  CBC     Status: Abnormal   Collection Time   04/06/11  6:30 AM      Component Value Range   WBC 5.2  4.0 - 10.5 (K/uL)   RBC 2.85 (*) 3.87 - 5.11 (MIL/uL)   Hemoglobin 8.9 (*) 12.0 - 15.0 (g/dL)   HCT 57.8 (*) 46.9 - 46.0 (%)   MCV 90.9  78.0 - 100.0 (fL)   MCH 31.2  26.0 - 34.0 (pg)   MCHC 34.4  30.0 - 36.0 (g/dL)   RDW 62.9  52.8 - 41.3 (%)   Platelets 142 (*) 150 - 400 (K/uL)  BASIC METABOLIC PANEL     Status: Abnormal   Collection Time   04/06/11  6:30 AM      Component Value Range   Sodium 140  135 - 145 (mEq/L)   Potassium 3.9  3.5 - 5.1 (mEq/L)   Chloride 108  96 - 112 (mEq/L)   CO2 24  19 - 32 (mEq/L)   Glucose, Bld 101 (*) 70 - 99 (mg/dL)   BUN 21  6 - 23 (mg/dL)   Creatinine, Ser 2.44  0.50 - 1.10 (mg/dL)   Calcium 8.5  8.4 - 01.0 (mg/dL)   GFR calc non Af Amer 60 (*) >90 (mL/min)   GFR calc Af Amer 69 (*) >90 (mL/min)  GLUCOSE, CAPILLARY     Status: Abnormal   Collection Time   04/06/11  7:40 AM      Component Value Range   Glucose-Capillary 108 (*) 70 - 99 (mg/dL)  CBC     Status: Abnormal   Collection Time  04/06/11 10:21 AM      Component Value Range   WBC 6.2  4.0 - 10.5 (K/uL)   RBC 2.96 (*) 3.87 - 5.11 (MIL/uL)   Hemoglobin 9.1 (*) 12.0 - 15.0 (g/dL)   HCT 16.1 (*) 09.6 - 46.0 (%)   MCV 90.9  78.0 - 100.0 (fL)   MCH 30.7  26.0 - 34.0 (pg)   MCHC 33.8  30.0 - 36.0 (g/dL)   RDW 04.5  40.9 - 81.1 (%)   Platelets 148 (*) 150 - 400 (K/uL)  GLUCOSE, CAPILLARY     Status: Abnormal   Collection Time   04/06/11 12:04 PM      Component Value Range   Glucose-Capillary 114 (*) 70 - 99 (mg/dL)    Signed: Kiyona Mcnall 04/06/2011, 4:20 PM

## 2011-04-07 DIAGNOSIS — K922 Gastrointestinal hemorrhage, unspecified: Secondary | ICD-10-CM

## 2011-04-07 LAB — CBC
HCT: 27.7 % — ABNORMAL LOW (ref 36.0–46.0)
MCH: 30.8 pg (ref 26.0–34.0)
MCHC: 33.2 g/dL (ref 30.0–36.0)
MCV: 92.6 fL (ref 78.0–100.0)
RDW: 15.4 % (ref 11.5–15.5)

## 2011-04-07 LAB — GLUCOSE, CAPILLARY: Glucose-Capillary: 147 mg/dL — ABNORMAL HIGH (ref 70–99)

## 2011-04-07 NOTE — Progress Notes (Signed)
Discharge review done with patient and son.   They both acknowledged understanding of information provided.    Patient is stable and discharged home with son. Alison Buckley

## 2011-04-07 NOTE — Progress Notes (Signed)
Subjective: She feels great, denies any more clots of bright red blood per rectum since yesterday morning.   Objective: Vital signs in last 24 hours: Filed Vitals:   04/06/11 1300 04/06/11 1455 04/06/11 2100 04/07/11 0500  BP: 148/84 156/69 154/59 159/65  Pulse: 68  71 85  Temp: 97.9 F (36.6 C)  99.1 F (37.3 C) 98.6 F (37 C)  TempSrc: Oral  Oral Oral  Resp: 20  18 18   Height:      Weight:    139 lb 12.4 oz (63.4 kg)  SpO2: 97%  96% 96%   Weight change: -5.7 oz (-0.16 kg)  Intake/Output Summary (Last 24 hours) at 04/07/11 0824 Last data filed at 04/07/11 0500  Gross per 24 hour  Intake    363 ml  Output    500 ml  Net   -137 ml   Physical Exam: General: Alert, cooperative, no apparent distress  HEENT: Normocephalic, moist mucous membranes  CV: RRR, grade 2 murmur present, best appreciated over left sternal border at 4th intercostal space  Pulm: CTAB, no wheezes, crackles, or rhonchi  Abd: Nontender, nondistended, normal BS+  Extremities: warm and well-perfused, no pedal edema  Neuro: Alert and oriented x3, CN II-XII grossly intact  Lab Results: Basic Metabolic Panel:  Lab 04/06/11 1610 04/04/11 0803  NA 140 140  K 3.9 3.5  CL 108 105  CO2 24 26  GLUCOSE 101* 145*  BUN 21 25*  CREATININE 0.85 0.86  CALCIUM 8.5 8.7  MG -- --  PHOS -- --  CBC:  Lab 04/06/11 1956 04/06/11 1021  WBC 6.2 6.2  NEUTROABS -- --  HGB 8.3* 9.1*  HCT 24.8* 26.9*  MCV 91.2 90.9  PLT 144* 148*   Cardiac Enzymes:  Lab 04/05/11 0754 04/05/11 0003 04/04/11 1606  CKTOTAL 58 56 52  CKMB 2.2 1.9 1.8  CKMBINDEX -- -- --  TROPONINI <0.30 <0.30 <0.30   CBG:  Lab 04/07/11 0749 04/06/11 2057 04/06/11 1644 04/06/11 1204 04/06/11 0740 04/05/11 2207  GLUCAP 147* 155* 135* 114* 108* 110*   Hemoglobin A1C:  Lab 04/04/11 1154  HGBA1C 6.1*   Fasting Lipid Panel:  Lab 04/05/11 0340  CHOL 100  HDL 38*  LDLCALC 38  TRIG 960  CHOLHDL 2.6  LDLDIRECT --   Thyroid Function  Tests:  Lab 04/04/11 0858  TSH 1.340  T4TOTAL --  FREET4 --  T3FREE --  THYROIDAB --   Coagulation:  Lab 04/04/11 1342  LABPROT 14.4  INR 1.10    Medications: Scheduled Meds:   . allopurinol  300 mg Oral Q0700  . calcium-vitamin D  1 tablet Oral QPM  . carvedilol  20 mg Oral Daily  . ferrous sulfate  325 mg Oral Q breakfast  . hydrALAZINE  12.5 mg Oral Q8H  . isosorbide mononitrate  60 mg Oral Daily  . levothyroxine  88 mcg Oral QAC breakfast  . linagliptin  5 mg Oral Daily  . pantoprazole  40 mg Oral BID AC  . ranolazine  500 mg Oral Q0700  . rosuvastatin  20 mg Oral QPM  . sertraline  50 mg Oral Q0700  . sodium chloride  3 mL Intravenous Q12H   Continuous Infusions:  PRN Meds:.sodium chloride, acetaminophen, acetaminophen, HYDROcodone-acetaminophen, nitroGLYCERIN, ondansetron (ZOFRAN) IV, ondansetron, sodium chloride, zolpidem Assessment/Plan: 1) GI bleed -Likely diverticular bleed. No active bleeding beside clots and she denies any lightheadedness when she was sitting up in chair. Hb is 9.3 which has been stable in the  past 2 days. --Will need to follow up CBC with her PCP in the next 1-2 days. --Protonix 40mg  po twice daily before meals  2) CHF (congestive heart failure) -  --Ranolazine 500mg  po daily  --Nitroglycerin 0.4 sublingual PRN chest pain  3) CAD (coronary artery disease) - Pt has a history of CABG and multiple PCIs. She has not had chest pain in the past 2 months and cardiac enzymes x3 are normal. We are holding aspirin and Plavix given high risk for further bleeding. Will consider resume ASA at discharge  4) Hypertension - Adequately controlled BP. Hydralazine was just restarted yesterday.    -Continue Isosorbide-mononitrate 60mg  po daily  -Continue Hydralazine 12.5mg  po q8h and PCP may resume her home dose hydralazine if her BP continues to be elevated 5) Diabetes mellitus - HgA1c 6.1 on 04/04/11. Fasting lipid panel with HDL=38 but otherwise normal. Will  continue monitoring blood glucose.  --Continue home linagliptin 5mg  po  6) Hyperlipidemia - Fasting lipid panel with HDL=38 but otherwise normal. Will continue rosuvastatin 40mg  daily.  7) Hypothyroid - TSH 1.34 (normal). Continue home Synthroid 88mg  po daily.  8) Falls precautions - Pt has a history of unsteadiness and falls in the past few years. She is also at risk for orthostatic hypotension. PT/OT  9) DVT PPX - SCDs   Dispo: home today with early follow up with PCP in the next few days for repeat CBC and BP check. I explained to patient and she voiced understanding  LOS: 3 days   Dat Derksen 04/07/2011, 8:24 AM

## 2011-07-08 ENCOUNTER — Ambulatory Visit
Admission: RE | Admit: 2011-07-08 | Discharge: 2011-07-08 | Disposition: A | Discharge status: Home / Self Care | Payer: Medicare Other | Source: Ambulatory Visit | Attending: Family Medicine | Admitting: Family Medicine

## 2011-07-08 ENCOUNTER — Other Ambulatory Visit: Payer: Self-pay | Admitting: Family Medicine

## 2011-07-08 DIAGNOSIS — M25551 Pain in right hip: Secondary | ICD-10-CM

## 2011-07-08 DIAGNOSIS — W19XXXA Unspecified fall, initial encounter: Secondary | ICD-10-CM

## 2011-07-08 DIAGNOSIS — M549 Dorsalgia, unspecified: Secondary | ICD-10-CM

## 2012-02-06 ENCOUNTER — Other Ambulatory Visit (HOSPITAL_COMMUNITY): Payer: Self-pay | Admitting: Cardiovascular Disease

## 2012-02-06 DIAGNOSIS — I6529 Occlusion and stenosis of unspecified carotid artery: Secondary | ICD-10-CM

## 2012-02-14 ENCOUNTER — Ambulatory Visit (HOSPITAL_COMMUNITY)
Admission: RE | Admit: 2012-02-14 | Discharge: 2012-02-14 | Disposition: A | Discharge status: Home / Self Care | Payer: Medicare Other | Source: Ambulatory Visit | Attending: Cardiovascular Disease | Admitting: Cardiovascular Disease

## 2012-02-14 DIAGNOSIS — I6529 Occlusion and stenosis of unspecified carotid artery: Secondary | ICD-10-CM | POA: Insufficient documentation

## 2012-02-14 DIAGNOSIS — I2510 Atherosclerotic heart disease of native coronary artery without angina pectoris: Secondary | ICD-10-CM

## 2012-02-14 HISTORY — PX: OTHER SURGICAL HISTORY: SHX169

## 2012-02-14 NOTE — Progress Notes (Signed)
Carotid Doppler completed. Alison Buckley  

## 2012-04-16 ENCOUNTER — Ambulatory Visit
Admission: RE | Admit: 2012-04-16 | Discharge: 2012-04-16 | Disposition: A | Discharge status: Home / Self Care | Payer: Medicare Other | Source: Ambulatory Visit | Attending: Family Medicine | Admitting: Family Medicine

## 2012-04-16 ENCOUNTER — Other Ambulatory Visit: Payer: Self-pay | Admitting: Family Medicine

## 2012-04-16 DIAGNOSIS — R0602 Shortness of breath: Secondary | ICD-10-CM

## 2012-06-15 ENCOUNTER — Encounter: Payer: Self-pay | Admitting: Physician Assistant

## 2012-06-15 ENCOUNTER — Ambulatory Visit (INDEPENDENT_AMBULATORY_CARE_PROVIDER_SITE_OTHER): Payer: Medicare Other | Admitting: Physician Assistant

## 2012-06-15 VITALS — BP 132/66 | HR 56 | Temp 97.5°F | Resp 18 | Wt 131.0 lb

## 2012-06-15 DIAGNOSIS — M5430 Sciatica, unspecified side: Secondary | ICD-10-CM

## 2012-06-15 DIAGNOSIS — M5432 Sciatica, left side: Secondary | ICD-10-CM

## 2012-06-15 DIAGNOSIS — I10 Essential (primary) hypertension: Secondary | ICD-10-CM

## 2012-06-15 MED ORDER — PREDNISONE 20 MG PO TABS: ORAL_TABLET | ORAL | Status: DC

## 2012-06-15 MED ORDER — HYDRALAZINE HCL 25 MG PO TABS: 12.5000 mg | ORAL_TABLET | Freq: Three times a day (TID) | ORAL | Status: DC

## 2012-06-15 NOTE — Progress Notes (Signed)
Patient ID: Alison Buckley MRN: [id number], DOB: 12/29/1922, 79 y.o. Date of Encounter: 06/15/2012, 1:15 PM    Chief Complaint:  Chief Complaint  Patient presents with  . c/o sharp pains radiating down left leg  . refill hydralazine     HPI: 79 y.o. year old female reports pain in left buttock and left lateral leg. Says this is a new problem. Has no h/o back pain or any h/o similar symptoms as these. This started 3 days ago. Sometimes feels the pin in the buttock area, sometimes in the lateral thigh, sometimes in the lower leg, even near the foot. But does not feel the pain in all areas at the same time. No numbness, tingling. No weakness in leg. No urine or bowel incontinence.      Home Meds: Current Outpatient Prescriptions on File Prior to Visit  Medication Sig Dispense Refill  . allopurinol (ZYLOPRIM) 300 MG tablet Take 300 mg by mouth every morning.      Marland Kitchen amLODipine (NORVASC) 10 MG tablet Take 5 mg by mouth daily.      Marland Kitchen aspirin EC 81 MG tablet Take 81 mg by mouth every morning.      . calcium-vitamin D (OSCAL WITH D) 500-200 MG-UNIT per tablet Take 1 tablet by mouth every evening.      . carvedilol (COREG CR) 20 MG 24 hr capsule Take 20 mg by mouth every evening.      . ferrous sulfate 325 (65 FE) MG tablet Take 325 mg by mouth daily with breakfast.      . furosemide (LASIX) 40 MG tablet Take 20 mg by mouth daily.      . isosorbide mononitrate (IMDUR) 60 MG 24 hr tablet Take 60 mg by mouth every morning.      Marland Kitchen levothyroxine (SYNTHROID, LEVOTHROID) 88 MCG tablet Take 88 mcg by mouth every morning.      . nitroGLYCERIN (NITROSTAT) 0.4 MG SL tablet Place 0.4 mg under the tongue every 5 (five) minutes as needed. For chest pain      . omeprazole (PRILOSEC) 20 MG capsule Take 2 capsules (40 mg total) by mouth every morning.  30 capsule  1  . ranolazine (RANEXA) 500 MG 12 hr tablet Take 500 mg by mouth every morning.      . rosuvastatin (CRESTOR) 20 MG tablet Take 20 mg by mouth every  evening.      . sertraline (ZOLOFT) 50 MG tablet Take 50 mg by mouth every morning.      . sitaGLIPtin (JANUVIA) 50 MG tablet Take 100 mg by mouth daily.       No current facility-administered medications on file prior to visit.    Allergies:  Allergies  Allergen Reactions  . Ampicillin Other (See Comments)    unknown      Review of Systems: Constitutional: negative for chills, fever, night sweats, weight changes, or fatigue  HEENT: negative for vision changes, hearing loss, congestion, rhinorrhea, ST, epistaxis, or sinus pressure Cardiovascular: negative for chest pain or palpitations Respiratory: negative for hemoptysis, wheezing, shortness of breath, or cough Abdominal: negative for abdominal pain, nausea, vomiting, diarrhea, or constipation Dermatological: negative for rash Neurologic: negative for headache, dizziness, or syncope    Physical Exam: Blood pressure 132/66, pulse 56, temperature 97.5 F (36.4 C), temperature source Oral, resp. rate 18, weight 131 lb (59.421 kg)., Body mass index is 26.44 kg/(m^2). General: Elderly WF, in no acute distress.  Neck: Supple. No thyromegaly. Full ROM. No lymphadenopathy. Lungs: Clear  bilaterally to auscultation without wheezes, rales, or rhonchi. Breathing is unlabored. Heart: RRR with S1 S2. No murmurs, rubs, or gallops appreciated. Abdomen: Soft, non-tender, non-distended with normoactive bowel sounds. No hepatomegaly. No rebound/guarding. No obvious abdominal masses. Msk:  Strength and tone normal for age. No pain with palpation of bilateral low back. Positive pain with palpation of left sciatic notch. Pt unable to maneuver to do SLR etc (walks with a walker or cane). Extremities/Skin: Warm and dry. No clubbing or cyanosis. No edema. No rashes or suspicious lesions. Neuro: Alert and oriented X 3. Moves all extremities spontaneously. Gait is at her baseline.Marland Kitchen CNII-XII grossly in tact. Psych:  Responds to questions appropriately  with a normal affect.   Labs:   ASSESSMENT AND PLAN:  79 y.o. year old female with  1. Sciatica neuralgia, left - predniSONE (DELTASONE) 20 MG tablet; Take 3 daily for 2 days, then 2 daily for 2 days, then 1 daily for 2 days.  Dispense: 12 tablet; Refill: 0 If symptoms do not resolve with above, f/u. Then would get xray.  2. HTN (hypertension) Pt needs refill on chronic medication--refill given. - hydrALAZINE (APRESOLINE) 25 MG tablet; Take 0.5 tablets (12.5 mg total) by mouth 3 (three) times daily.  Dispense: 90 tablet; Refill: 0   Signed, [address] [place], [place], [facility] 06/15/2012 1:15 PM

## 2012-10-27 ENCOUNTER — Other Ambulatory Visit: Payer: Self-pay | Admitting: Family Medicine

## 2013-02-01 ENCOUNTER — Telehealth: Payer: Self-pay | Admitting: Family Medicine

## 2013-02-01 ENCOUNTER — Other Ambulatory Visit: Payer: Self-pay | Admitting: Family Medicine

## 2013-02-01 MED ORDER — CEPHALEXIN 500 MG PO CAPS: 500.0000 mg | ORAL_CAPSULE | Freq: Three times a day (TID) | ORAL | Status: DC

## 2013-02-01 NOTE — Telephone Encounter (Signed)
Patient aware.

## 2013-02-01 NOTE — Telephone Encounter (Signed)
Yes keflex 500 tid for 7 days.  I will e-scribe.

## 2013-02-01 NOTE — Telephone Encounter (Signed)
Pt thinks she has another UTI and wants to know since we have no appts can we call her in something like we did last time? She can take Cephalexin but not Levaquin cause it makes her too dizzy.

## 2013-02-17 ENCOUNTER — Encounter: Payer: Self-pay | Admitting: Cardiovascular Disease

## 2013-02-22 ENCOUNTER — Encounter: Payer: Self-pay | Admitting: Cardiovascular Disease

## 2013-02-22 ENCOUNTER — Ambulatory Visit (INDEPENDENT_AMBULATORY_CARE_PROVIDER_SITE_OTHER): Payer: Medicare Other | Admitting: Cardiovascular Disease

## 2013-02-22 VITALS — BP 132/70 | HR 62 | Ht <= 58 in | Wt 133.0 lb

## 2013-02-22 DIAGNOSIS — E785 Hyperlipidemia, unspecified: Secondary | ICD-10-CM

## 2013-02-22 DIAGNOSIS — I739 Peripheral vascular disease, unspecified: Secondary | ICD-10-CM

## 2013-02-22 DIAGNOSIS — I2510 Atherosclerotic heart disease of native coronary artery without angina pectoris: Secondary | ICD-10-CM

## 2013-02-22 DIAGNOSIS — I10 Essential (primary) hypertension: Secondary | ICD-10-CM

## 2013-02-22 DIAGNOSIS — E039 Hypothyroidism, unspecified: Secondary | ICD-10-CM

## 2013-02-22 DIAGNOSIS — E119 Type 2 diabetes mellitus without complications: Secondary | ICD-10-CM

## 2013-02-22 MED ORDER — RANOLAZINE ER 500 MG PO TB12: 500.0000 mg | ORAL_TABLET | ORAL | Status: AC

## 2013-02-22 MED ORDER — CARVEDILOL 6.25 MG PO TABS: 6.2500 mg | ORAL_TABLET | Freq: Two times a day (BID) | ORAL | Status: DC

## 2013-02-22 MED ORDER — NITROGLYCERIN 0.4 MG SL SUBL: 0.4000 mg | SUBLINGUAL_TABLET | SUBLINGUAL | Status: AC | PRN

## 2013-02-22 NOTE — Patient Instructions (Addendum)
Your physician recommends that you schedule a follow-up appointment in: One year.  Change Coreg to Carvedilol 6.25mg  twice a day.

## 2013-03-08 ENCOUNTER — Encounter: Payer: Self-pay | Admitting: Cardiovascular Disease

## 2013-03-08 DIAGNOSIS — E785 Hyperlipidemia, unspecified: Secondary | ICD-10-CM

## 2013-03-08 HISTORY — DX: Hyperlipidemia, unspecified: E78.5

## 2013-03-08 NOTE — Assessment & Plan Note (Signed)
Lipid parameters are within the desirable range on current regimen

## 2013-03-08 NOTE — Assessment & Plan Note (Addendum)
She is free of angina despite severe CAD with involvement of both her native coronaries and LIMA graft. Her last nuclear stress test was performed in April of 2012. No visible reversible defects were seen. Therapy is primarily ureter towards symptom relief and prevention of disease progression. Both the patient and her son clearly prefer noninvasive management.

## 2013-03-08 NOTE — Assessment & Plan Note (Signed)
Reports a good hemoglobin A1c at her last check in September (5.9%)

## 2013-03-08 NOTE — Assessment & Plan Note (Signed)
Compensated on supplement with normal TSH and free T4 in September

## 2013-03-08 NOTE — Assessment & Plan Note (Signed)
Good blood pressure control 

## 2013-03-08 NOTE — Progress Notes (Signed)
Patient ID: Alison Buckley, female   DOB: 31-Mar-1922, 79 y.o.   MRN: [id number]      Reason for office visit Followup CAD, PVD, hyperlipidemia,  Mrs. Dudek is generally doing well. See her for her annual visits, as always accompanied by her son. She spends the summers in the [place] and a return here for the winter months. She last saw her cardiologist in [place], [place], Dr. Elfredia Nevins, in September of this year. Labs performed at that time showed excellent lipid profile and hemoglobin A1c and no change in her mild to moderate renal insufficiency. She is chronically short of breath but this has not changed since her last appointment. Denies angina. Severe bilateral peripheral arterial disease, but does not have intermittent claudication, probably because of her very sedentary lifestyle. Most recently the ankle-brachial index was 0.62 on the right and 0.89 on the left. She has not had any ulcers on her feet. She states that the biggest limitation to her activity level is her fear of falling and her unsteady gait. Brand Coreg has become unaffordable. She would like to switch to a generic.  Allergies  Allergen Reactions  . Ampicillin Other (See Comments)    unknown  . Levofloxacin     Dizzy     Current Outpatient Prescriptions  Medication Sig Dispense Refill  . allopurinol (ZYLOPRIM) 300 MG tablet Take 300 mg by mouth every morning.      Marland Kitchen amLODipine (NORVASC) 10 MG tablet Take 5 mg by mouth 2 (two) times daily.       Marland Kitchen aspirin EC 81 MG tablet Take 81 mg by mouth every morning.      . calcium-vitamin D (OSCAL WITH D) 500-200 MG-UNIT per tablet Take 1 tablet by mouth every evening.      . ferrous sulfate 325 (65 FE) MG tablet Take 325 mg by mouth daily with breakfast.      . furosemide (LASIX) 40 MG tablet Take 20 mg by mouth daily.      . hydrALAZINE (APRESOLINE) 25 MG tablet Take 25 mg by mouth 4 (four) times daily.      . isosorbide mononitrate (IMDUR) 60 MG 24 hr tablet Take 60 mg by mouth every  morning.      Marland Kitchen levothyroxine (SYNTHROID, LEVOTHROID) 88 MCG tablet Take 88 mcg by mouth every morning.      . mometasone (ELOCON) 0.1 % cream APPLY TO THE AFFECTED AREA TWICE DAILY FOR 10 DAYS  45 g  0  . nitroGLYCERIN (NITROSTAT) 0.4 MG SL tablet Place 1 tablet (0.4 mg total) under the tongue every 5 (five) minutes as needed. For chest pain  25 tablet  11  . omeprazole (PRILOSEC) 20 MG capsule Take 20 mg by mouth 2 (two) times daily before a meal.      . ranolazine (RANEXA) 500 MG 12 hr tablet Take 1 tablet (500 mg total) by mouth every morning.  56 tablet  0  . rosuvastatin (CRESTOR) 20 MG tablet Take 20 mg by mouth every evening.      . sertraline (ZOLOFT) 50 MG tablet Take 50 mg by mouth every morning.      . carvedilol (COREG) 6.25 MG tablet Take 1 tablet (6.25 mg total) by mouth 2 (two) times daily.  180 tablet  3  . sitaGLIPtin (JANUVIA) 50 MG tablet Take 100 mg by mouth daily.       No current facility-administered medications for this visit.    Past Medical History  Diagnosis Date  .  CHF (congestive heart failure)   . Hypertension   . Diabetes mellitus   . Blood transfusion   . Anemia   . High cholesterol   . Peripheral vascular disease   . PAD (peripheral artery disease)   . Ischemic cardiomyopathy   . Pneumonia 04/2009    community acquired pneumonia  . Hypothyroidism   . GIB (gastrointestinal bleeding) 2011 & 04/04/11  . GERD (gastroesophageal reflux disease)   . Arthritis   . Gout   . PAD (peripheral artery disease), stents to L. renal art. and stents to [facility] 04/05/2011  . CAD (coronary artery disease)   . Renal artery stenosis   . Carotid stenosis     Moderate-severe, right  . Dyslipidemia   . Hyperlipidemia 03/08/2013    Past Surgical History  Procedure Laterality Date  . Bladder suspension    . Abdominal hysterectomy  ~ 1953  . Appendectomy  ~ 1953  . Cataract extraction w/ intraocular lens  implant, bilateral  2000's   . Coronary angioplasty with stent  placement  1986    "2"  . Coronary angioplasty with stent placement  ~ 1988    "1"  . Coronary artery bypass graft  1996    x3. LIMA to the LAD, SVG to the OM, and SVG to the RCA  . Cardiac catheterization  06/22/2007    Circumflex occluded at origin, RCA proximal occlusion and had 95% before PDA take off. No intervention, recommended medical therapy.  . Peripheral vascular angiogram  04/18/2008    No intervention - recomend renal angioplasty at later date.  . Peripheral vascular angiogram  04/27/2008    L Renal artery 90% stenosis, stented w/ a 5.5x77mm Herculink stent at 15atm, resulting in reduction of 90% stenosis to 0% residual. L Proximal Popliteal artery stented w/ a 6x26mm EverFlex self-expanding stent. L SFA 89% stenosis, stented w/ a 6x116mm LifeStent self-expanding stent, resulting in reduction of 89% to 0% residual.  . Transthoracic echocardiogram  05/19/2009    EF 50-55%, trivial aortic regurg, mild mitral regurg.  . Cardiovascular stress test  06/28/2010    Significant mis-registration of heaert borders w/ significant liver/bowel uptake leading to artificial dropout counts. Visualized myocardium appears to be free of reversible defects. No persantine EKG changes.  . Carotid doppler  02/14/2012    Bilat CCA-moderate amount of plaque w/o evidence of significant diameter reduction. R Prox ICA-moderate amount suggestive of 70-99% diameter reduction. L Bulb/Proximal ICA-moderate amount w/o evidence of significant diameter reduction. L Vertebral-appeared occluded.    Family History  Problem Relation Age of Onset  . Stroke Mother   . Cancer Sister   . Heart disease Sister   . Cancer Sister     History   Social History  . Marital Status: Widowed    Spouse Name: N/A    Number of Children: N/A  . Years of Education: N/A   Occupational History  . Not on file.   Social History Main Topics  . Smoking status: Former Smoker -- 0.25 packs/day for 10 years    Types: Cigarettes  .  Smokeless tobacco: Never Used     Comment: "stopped smoking probably 1980's"  . Alcohol Use: Yes     Comment: 04/04/11 "special occasion maybe 2-3 mixed drinks"  . Drug Use: No  . Sexual Activity: No   Other Topics Concern  . Not on file   Social History Narrative  . No narrative on file    Review of systems: The patient specifically denies  any chest pain at rest or with exertion, dyspnea at rest (he has dyspnea with mild exertion), orthopnea, paroxysmal nocturnal dyspnea, syncope, palpitations, focal neurological deficits, intermittent claudication, lower extremity edema, unexplained weight gain, cough, hemoptysis or wheezing.  The patient also denies abdominal pain, nausea, vomiting, dysphagia, diarrhea, constipation, polyuria, polydipsia, dysuria, hematuria, frequency, urgency, abnormal bleeding or bruising, fever, chills, unexpected weight changes, mood swings, change in skin or hair texture, change in voice quality, auditory or visual problems, allergic reactions or rashes, new musculoskeletal complaints other than usual "aches and pains".   PHYSICAL EXAM BP 132/70  Pulse 62  Ht 4\' 10"  (1.473 m)  Wt 133 lb (60.328 kg)  BMI 27.80 kg/m2  General: Alert, oriented x3, no distress Head: no evidence of trauma, PERRL, EOMI, no exophtalmos or lid lag, no myxedema, no xanthelasma; normal ears, nose and oropharynx Neck: normal jugular venous pulsations and no hepatojugular reflux; brisk carotid pulses without delay and no carotid bruits Chest: clear to auscultation, no signs of consolidation by percussion or palpation, normal fremitus, symmetrical and full respiratory excursions Cardiovascular: normal position and quality of the apical impulse, regular rhythm, normal first and second heart sounds, no murmurs, rubs or gallops Abdomen: no tenderness or distention, no masses by palpation, no abnormal pulsatility or arterial bruits, normal bowel sounds, no hepatosplenomegaly Extremities: no  clubbing, cyanosis or edema; 2+ radial, ulnar and brachial pulses bilaterally; 2+ right femoral, barely palpable posterior tibial and dorsalis pedis pulses; 2+ left femoral, barely palpable posterior tibial and dorsalis pedis pulses; no subclavian or femoral bruits Neurological: grossly nonfocal   EKG: Normal sinus rhythm, incomplete right bundle branch block (QRS 160 ms) , pre-existing T wave inversion in the inferior and anterolateral leads, unchanged  Lipid Panel  September 2014 Hemoglobin A1c 5.9%, creatinine 1.25, estimated GFR 40, TSH 1.4, total cholesterol 128, triglycerides 180, HDL 50, direct LDL 63      Component Value Date/Time   CHOL 100 04/05/2011 0340   TRIG 118 04/05/2011 0340   HDL 38* 04/05/2011 0340   CHOLHDL 2.6 04/05/2011 0340   VLDL 24 04/05/2011 0340   LDLCALC 38 04/05/2011 0340    BMET    Component Value Date/Time   NA 140 04/06/2011 0630   K 3.9 04/06/2011 0630   CL 108 04/06/2011 0630   CO2 24 04/06/2011 0630   GLUCOSE 101* 04/06/2011 0630   BUN 21 04/06/2011 0630   CREATININE 0.85 04/06/2011 0630   CALCIUM 8.5 04/06/2011 0630   GFRNONAA 60* 04/06/2011 0630   GFRAA 69* 04/06/2011 0630     ASSESSMENT AND PLAN CAD (coronary artery disease), CABG 1996, stent to Diag approx 1997 She is free of angina despite severe CAD with involvement of both her native coronaries and grafts. Her last nuclear stress test was performed in April of 2012. No visible reversible defects were seen. Therapy is primarily ureter towards symptom relief and prevention of disease progression. Both the patient and her son clearly prefer noninvasive management.  HTN (hypertension) Good blood pressure control  DM (diabetes mellitus) Reports a good hemoglobin A1c at her last check in September (5.9%)  Hypothyroid Compensated on supplement with normal TSH and free T4 in September  PAD (peripheral artery disease), stents to L. renal art. and stents to LSFA Without any symptoms of  claudication  Orders Placed This Encounter  Procedures  . EKG 12-Lead   Meds ordered this encounter  Medications  . omeprazole (PRILOSEC) 20 MG capsule    Sig: Take 20 mg by  mouth 2 (two) times daily before a meal.  . hydrALAZINE (APRESOLINE) 25 MG tablet    Sig: Take 25 mg by mouth 4 (four) times daily.  . nitroGLYCERIN (NITROSTAT) 0.4 MG SL tablet    Sig: Place 1 tablet (0.4 mg total) under the tongue every 5 (five) minutes as needed. For chest pain    Dispense:  25 tablet    Refill:  11  . carvedilol (COREG) 6.25 MG tablet    Sig: Take 1 tablet (6.25 mg total) by mouth 2 (two) times daily.    Dispense:  180 tablet    Refill:  3  . ranolazine (RANEXA) 500 MG 12 hr tablet    Sig: Take 1 tablet (500 mg total) by mouth every morning.    Dispense:  56 tablet    Refill:  0    LOT: [id number] EXP: 12/2015   Patient Instructions  Your physician recommends that you schedule a follow-up appointment in: One year.  Change Coreg to Carvedilol 6.25mg  twice a day.          Junious Silk, MD, [facility] [phone] office [phone] pager

## 2013-03-08 NOTE — Assessment & Plan Note (Signed)
Without any symptoms of claudication

## 2013-03-09 ENCOUNTER — Emergency Department (HOSPITAL_COMMUNITY): Payer: Medicare Other

## 2013-03-09 ENCOUNTER — Emergency Department (HOSPITAL_COMMUNITY)
Admission: EM | Admit: 2013-03-09 | Discharge: 2013-03-09 | Disposition: A | Discharge status: Home / Self Care | Payer: Medicare Other | Attending: Emergency Medicine | Admitting: Emergency Medicine

## 2013-03-09 ENCOUNTER — Encounter (HOSPITAL_COMMUNITY): Payer: Self-pay | Admitting: Emergency Medicine

## 2013-03-09 DIAGNOSIS — E119 Type 2 diabetes mellitus without complications: Secondary | ICD-10-CM | POA: Insufficient documentation

## 2013-03-09 DIAGNOSIS — M7989 Other specified soft tissue disorders: Secondary | ICD-10-CM | POA: Insufficient documentation

## 2013-03-09 DIAGNOSIS — R059 Cough, unspecified: Secondary | ICD-10-CM | POA: Insufficient documentation

## 2013-03-09 DIAGNOSIS — Z880 Allergy status to penicillin: Secondary | ICD-10-CM | POA: Insufficient documentation

## 2013-03-09 DIAGNOSIS — R0789 Other chest pain: Secondary | ICD-10-CM | POA: Insufficient documentation

## 2013-03-09 DIAGNOSIS — Z87891 Personal history of nicotine dependence: Secondary | ICD-10-CM | POA: Insufficient documentation

## 2013-03-09 DIAGNOSIS — Z7982 Long term (current) use of aspirin: Secondary | ICD-10-CM | POA: Insufficient documentation

## 2013-03-09 DIAGNOSIS — M129 Arthropathy, unspecified: Secondary | ICD-10-CM | POA: Insufficient documentation

## 2013-03-09 DIAGNOSIS — Z95818 Presence of other cardiac implants and grafts: Secondary | ICD-10-CM | POA: Insufficient documentation

## 2013-03-09 DIAGNOSIS — I2510 Atherosclerotic heart disease of native coronary artery without angina pectoris: Secondary | ICD-10-CM | POA: Insufficient documentation

## 2013-03-09 DIAGNOSIS — R05 Cough: Secondary | ICD-10-CM | POA: Insufficient documentation

## 2013-03-09 DIAGNOSIS — I509 Heart failure, unspecified: Secondary | ICD-10-CM | POA: Insufficient documentation

## 2013-03-09 DIAGNOSIS — M109 Gout, unspecified: Secondary | ICD-10-CM | POA: Insufficient documentation

## 2013-03-09 DIAGNOSIS — E039 Hypothyroidism, unspecified: Secondary | ICD-10-CM | POA: Insufficient documentation

## 2013-03-09 DIAGNOSIS — I10 Essential (primary) hypertension: Secondary | ICD-10-CM | POA: Insufficient documentation

## 2013-03-09 DIAGNOSIS — Z862 Personal history of diseases of the blood and blood-forming organs and certain disorders involving the immune mechanism: Secondary | ICD-10-CM | POA: Insufficient documentation

## 2013-03-09 DIAGNOSIS — Z8701 Personal history of pneumonia (recurrent): Secondary | ICD-10-CM | POA: Insufficient documentation

## 2013-03-09 DIAGNOSIS — K219 Gastro-esophageal reflux disease without esophagitis: Secondary | ICD-10-CM | POA: Insufficient documentation

## 2013-03-09 DIAGNOSIS — E785 Hyperlipidemia, unspecified: Secondary | ICD-10-CM | POA: Insufficient documentation

## 2013-03-09 DIAGNOSIS — G8929 Other chronic pain: Secondary | ICD-10-CM | POA: Insufficient documentation

## 2013-03-09 DIAGNOSIS — Z79899 Other long term (current) drug therapy: Secondary | ICD-10-CM | POA: Insufficient documentation

## 2013-03-09 DIAGNOSIS — R011 Cardiac murmur, unspecified: Secondary | ICD-10-CM | POA: Insufficient documentation

## 2013-03-09 DIAGNOSIS — Z9861 Coronary angioplasty status: Secondary | ICD-10-CM | POA: Insufficient documentation

## 2013-03-09 DIAGNOSIS — Z951 Presence of aortocoronary bypass graft: Secondary | ICD-10-CM | POA: Insufficient documentation

## 2013-03-09 LAB — CBC
HCT: 33.7 % — ABNORMAL LOW (ref 36.0–46.0)
Hemoglobin: 11.3 g/dL — ABNORMAL LOW (ref 12.0–15.0)
MCV: 95.2 fL (ref 78.0–100.0)
WBC: 6 10*3/uL (ref 4.0–10.5)

## 2013-03-09 LAB — BASIC METABOLIC PANEL
BUN: 31 mg/dL — ABNORMAL HIGH (ref 6–23)
CO2: 25 mEq/L (ref 19–32)
Calcium: 9.2 mg/dL (ref 8.4–10.5)
GFR calc non Af Amer: 53 mL/min — ABNORMAL LOW (ref >90)
Glucose, Bld: 139 mg/dL — ABNORMAL HIGH (ref 70–99)
Sodium: 139 mEq/L (ref 135–145)

## 2013-03-09 LAB — TROPONIN I: Troponin I: <0.3 ng/mL (ref <0.30)

## 2013-03-09 LAB — PRO B NATRIURETIC PEPTIDE: Pro B Natriuretic peptide (BNP): 1700 pg/mL — ABNORMAL HIGH (ref 0–450)

## 2013-03-09 MED ORDER — FUROSEMIDE 10 MG/ML IJ SOLN
60.0000 mg | Freq: Once | INTRAMUSCULAR | Status: AC
  Administered 2013-03-09: 60 mg via INTRAVENOUS
  Filled 2013-03-09: qty 6

## 2013-03-09 NOTE — ED Notes (Signed)
Pt reports increased SOB since this AM. States she is constantly SOB, but it seems worse today. Improved when sitting up. Pt reports eating chicken noodle soup canned yesterday (pt not aware that this is high sodium). Pt denies SOB at this time.

## 2013-03-09 NOTE — ED Provider Notes (Addendum)
CSN: [id number]     Arrival date & time 03/09/13  0848 History   First MD Initiated Contact with Patient 03/09/13 [phone]     Chief Complaint  Patient presents with  . Shortness of Breath   (Consider location/radiation/quality/duration/timing/severity/associated sxs/prior Treatment) HPI Patient reports during the night she got short of breath. She had to sit up to breathe better. She states she did not have chest pain but she had chest pressure for a couple of hours that she describes as mild. She states she coughed and once coughed up clear mucus. She denies any wheezing. She states she has chronic swelling of her legs better not worse. She denies any abdominal swelling. She denies fever. She states she's had this before with congestive heart failure. She states yesterday she and her son ate soup from a package, they didn't realize that would contain a lot of salt.   PCP Dr Tanya Nones Cardiology Dr Royann Shivers  Past Medical History  Diagnosis Date  . CHF (congestive heart failure)   . Hypertension   . Diabetes mellitus   . Blood transfusion   . Anemia   . High cholesterol   . Peripheral vascular disease   . PAD (peripheral artery disease)   . Ischemic cardiomyopathy   . Pneumonia 04/2009    community acquired pneumonia  . Hypothyroidism   . GIB (gastrointestinal bleeding) 2011 & 04/04/11  . GERD (gastroesophageal reflux disease)   . Arthritis   . Gout   . PAD (peripheral artery disease), stents to L. renal art. and stents to [facility] 04/05/2011  . CAD (coronary artery disease)   . Renal artery stenosis   . Carotid stenosis     Moderate-severe, right  . Dyslipidemia   . Hyperlipidemia 03/08/2013   Past Surgical History  Procedure Laterality Date  . Bladder suspension    . Abdominal hysterectomy  ~ 1953  . Appendectomy  ~ 1953  . Cataract extraction w/ intraocular lens  implant, bilateral  2000's   . Coronary angioplasty with stent placement  1986    "2"  . Coronary angioplasty with  stent placement  ~ 1988    "1"  . Coronary artery bypass graft  1996    x3. LIMA to the LAD, SVG to the OM, and SVG to the RCA  . Cardiac catheterization  06/22/2007    Circumflex occluded at origin, RCA proximal occlusion and had 95% before PDA take off. No intervention, recommended medical therapy.  . Peripheral vascular angiogram  04/18/2008    No intervention - recomend renal angioplasty at later date.  . Peripheral vascular angiogram  04/27/2008    L Renal artery 90% stenosis, stented w/ a 5.5x70mm Herculink stent at 15atm, resulting in reduction of 90% stenosis to 0% residual. L Proximal Popliteal artery stented w/ a 6x39mm EverFlex self-expanding stent. L SFA 89% stenosis, stented w/ a 6x162mm LifeStent self-expanding stent, resulting in reduction of 89% to 0% residual.  . Transthoracic echocardiogram  05/19/2009    EF 50-55%, trivial aortic regurg, mild mitral regurg.  . Cardiovascular stress test  06/28/2010    Significant mis-registration of heaert borders w/ significant liver/bowel uptake leading to artificial dropout counts. Visualized myocardium appears to be free of reversible defects. No persantine EKG changes.  . Carotid doppler  02/14/2012    Bilat CCA-moderate amount of plaque w/o evidence of significant diameter reduction. R Prox ICA-moderate amount suggestive of 70-99% diameter reduction. L Bulb/Proximal ICA-moderate amount w/o evidence of significant diameter reduction. L Vertebral-appeared occluded.  Family History  Problem Relation Age of Onset  . Stroke Mother   . Cancer Sister   . Heart disease Sister   . Cancer Sister    History  Substance Use Topics  . Smoking status: Former Smoker -- 0.25 packs/day for 10 years    Types: Cigarettes  . Smokeless tobacco: Never Used     Comment: "stopped smoking probably 1980's"  . Alcohol Use: Yes     Comment: 04/04/11 "special occasion maybe 2-3 mixed drinks"   Lives at home Lives with son  OB History   Grav Para Term Preterm  Abortions TAB SAB Ect Mult Living                 Review of Systems  All other systems reviewed and are negative.    Allergies  Ampicillin and Levofloxacin  Home Medications   Current Outpatient Rx  Name  Route  Sig  Dispense  Refill  . allopurinol (ZYLOPRIM) 300 MG tablet   Oral   Take 300 mg by mouth daily.          Marland Kitchen amLODipine (NORVASC) 5 MG tablet   Oral   Take 5 mg by mouth 2 (two) times daily.         Marland Kitchen aspirin EC 81 MG tablet   Oral   Take 81 mg by mouth daily.          . calcium-vitamin D (OSCAL WITH D) 500-200 MG-UNIT per tablet   Oral   Take 1 tablet by mouth every evening.         . carvedilol (COREG) 6.25 MG tablet   Oral   Take 1 tablet (6.25 mg total) by mouth 2 (two) times daily.   180 tablet   3   . furosemide (LASIX) 20 MG tablet   Oral   Take 20 mg by mouth daily.         . hydrALAZINE (APRESOLINE) 25 MG tablet   Oral   Take 25 mg by mouth 4 (four) times daily.         . IRON PO   Oral   Take 1 tablet by mouth daily.         . isosorbide mononitrate (IMDUR) 60 MG 24 hr tablet   Oral   Take 60 mg by mouth every morning.         Marland Kitchen levothyroxine (SYNTHROID, LEVOTHROID) 88 MCG tablet   Oral   Take 88 mcg by mouth daily before breakfast.          . nitroGLYCERIN (NITROSTAT) 0.4 MG SL tablet   Sublingual   Place 1 tablet (0.4 mg total) under the tongue every 5 (five) minutes as needed. For chest pain   25 tablet   11   . omeprazole (PRILOSEC) 20 MG capsule   Oral   Take 20 mg by mouth 2 (two) times daily before a meal.         . ranolazine (RANEXA) 500 MG 12 hr tablet   Oral   Take 1 tablet (500 mg total) by mouth every morning.   56 tablet   0     LOT: [id number] EXP: 12/2015   . rosuvastatin (CRESTOR) 10 MG tablet   Oral   Take 10 mg by mouth daily.         . sertraline (ZOLOFT) 50 MG tablet   Oral   Take 50 mg by mouth daily.          Marland Kitchen  sitaGLIPtin (JANUVIA) 50 MG tablet   Oral   Take 50 mg by  mouth daily.           BP 169/50  Pulse 70  Temp(Src) 98.1 F (36.7 C) (Oral)  Resp 22  Ht 4\' 10"  (1.473 m)  Wt 134 lb (60.782 kg)  BMI 28.01 kg/m2  SpO2 96%  Vital signs normal   Physical Exam  Nursing note and vitals reviewed. Constitutional: She is oriented to person, place, and time. She appears well-developed and well-nourished.  Non-toxic appearance. She does not appear ill. No distress.  HENT:  Head: Normocephalic and atraumatic.  Right Ear: External ear normal.  Left Ear: External ear normal.  Nose: Nose normal. No mucosal edema or rhinorrhea.  Mouth/Throat: Oropharynx is clear and moist and mucous membranes are normal. No dental abscesses or uvula swelling.  Eyes: Conjunctivae and EOM are normal. Pupils are equal, round, and reactive to light.  Neck: Normal range of motion and full passive range of motion without pain. Neck supple.  Cardiovascular: Normal rate and regular rhythm.  Exam reveals no gallop and no friction rub.   Murmur heard.  Systolic murmur is present  Heard best in the LUSB  Pulmonary/Chest: Effort normal and breath sounds normal. No respiratory distress. She has no wheezes. She has no rhonchi. She has no rales. She exhibits no tenderness and no crepitus.  Abdominal: Soft. Normal appearance and bowel sounds are normal. She exhibits no distension. There is no tenderness. There is no rebound and no guarding.  Musculoskeletal: Normal range of motion. She exhibits no edema and no tenderness.  Moves all extremities well.   Neurological: She is alert and oriented to person, place, and time. She has normal strength. No cranial nerve deficit.  Skin: Skin is warm, dry and intact. No rash noted. No erythema. No pallor.  Psychiatric: She has a normal mood and affect. Her speech is normal and behavior is normal. Her mood appears not anxious.    ED Course  Procedures (including critical care time)  Medications  furosemide (LASIX) injection 60 mg (60 mg  Intravenous Given 03/09/13 1014)    Nurse reports patient has had a liter of urine output.  Pt ambulated in her room with pulse ox of 96 %. States she didn't get SOB until she laid back down on the stretcher. She was offered admission, however she choose to be discharged home with increased lasix for the next 2 days.   Labs Review Results for orders placed during the hospital encounter of 03/09/13  CBC      Result Value Range   WBC 6.0  4.0 - 10.5 K/uL   RBC 3.54 (*) 3.87 - 5.11 MIL/uL   Hemoglobin 11.3 (*) 12.0 - 15.0 g/dL   HCT 08.6 (*) 57.8 - 46.9 %   MCV 95.2  78.0 - 100.0 fL   MCH 31.9  26.0 - 34.0 pg   MCHC 33.5  30.0 - 36.0 g/dL   RDW 62.9  52.8 - 41.3 %   Platelets 154  150 - 400 K/uL  BASIC METABOLIC PANEL      Result Value Range   Sodium 139  135 - 145 mEq/L   Potassium 4.5  3.5 - 5.1 mEq/L   Chloride 105  96 - 112 mEq/L   CO2 25  19 - 32 mEq/L   Glucose, Bld 139 (*) 70 - 99 mg/dL   BUN 31 (*) 6 - 23 mg/dL   Creatinine, Ser 2.44  0.50 -  1.10 mg/dL   Calcium 9.2  8.4 - 95.2 mg/dL   GFR calc non Af Amer 53 (*) >90 mL/min   GFR calc Af Amer 62 (*) >90 mL/min  PRO B NATRIURETIC PEPTIDE      Result Value Range   Pro B Natriuretic peptide (BNP) 1700.0 (*) 0 - 450 pg/mL  TROPONIN I      Result Value Range   Troponin I <0.30  <0.30 ng/mL   Laboratory interpretation all normal except elevated BNP, mild anemia    Imaging Review Dg Chest Portable 1 View  03/09/2013   CLINICAL DATA:  79 year old female with shortness of breath. Initial encounter.  EXAM: PORTABLE CHEST - 1 VIEW  COMPARISON:  04/16/2012 and earlier.  FINDINGS: Portable AP semi upright view at 0947 hrs. Stable appearance of postoperative changes to the mediastinum. Stable cardiac size and mediastinal contours. No pneumothorax. No pulmonary edema. Mild blunting of the left costophrenic angle. Interval clearing of the right costophrenic angle. No consolidation. Degenerative osseous changes at both shoulders.   IMPRESSION: Cannot exclude small left pleural effusion. No other acute cardiopulmonary abnormality identified.   Electronically Signed   By: Augusto Gamble M.D.   On: 03/09/2013 10:08    EKG Interpretation    Date/Time:  Tuesday March 09 2013 08:52:07 EST Ventricular Rate:  70 PR Interval:  166 QRS Duration: 116 QT Interval:  404 QTC Calculation: 436 R Axis:   108 Text Interpretation:  Normal sinus rhythm Incomplete right bundle branch block Right ventricular hypertrophy with repolarization abnormality Nonspecific T wave abnormality No significant change since last tracing Confirmed by Stephinie Battisti  MD-I, Verl Kitson ([id number]) on 03/09/2013 9:28:35 AM            MDM   1. CHF exacerbation     Plan discharge   Devoria Albe, MD, Franz Dell, MD 03/09/13 1251  Ward Givens, MD 03/09/13 1343

## 2013-03-09 NOTE — ED Notes (Signed)
Pt states she is SOB almost all the time, but it was worse this morning.  Pt sitting in wheelchair talking in complete sentences and in NAD.

## 2013-03-09 NOTE — ED Notes (Signed)
Pt 96% when ambulating in room. Denies SOB.

## 2013-03-10 ENCOUNTER — Telehealth: Payer: Self-pay | Admitting: Cardiovascular Disease

## 2013-03-10 NOTE — Telephone Encounter (Signed)
Message forwarded to Dr. Croitoru for further instructions.  

## 2013-03-10 NOTE — Telephone Encounter (Signed)
Pt was in [facility] ER yesterday,they told her to notify Dr C and tell  Him she was having a hard time breathing. She was full of fluid and they gave her something for it.

## 2013-03-11 NOTE — Telephone Encounter (Signed)
Returned call and pt verified x 2.  Pt informed Dr. Royann Shivers would like her to come in to be seen.  Pt stated she doesn't think it's necessary b/c she has an appt w/ her PCP on Monday.  Pt informed Dr. Royann Shivers will be notified.  Pt verbalized understanding and agreed w/ plan.

## 2013-03-11 NOTE — Telephone Encounter (Signed)
Returned call and pt verified x 2 w/ pt's son, Maisie Fus.  Stated pt is asleep and will have her call back when she wakes up.

## 2013-03-11 NOTE — Telephone Encounter (Signed)
Yes, we should, but I have no room today and am in hospital tomorrow. PA/NP openings?

## 2013-03-11 NOTE — Telephone Encounter (Signed)
That's fine

## 2013-03-11 NOTE — Telephone Encounter (Signed)
Pt needs ER f/u appt today or tomorrow.

## 2013-03-15 ENCOUNTER — Encounter: Payer: Self-pay | Admitting: Family Medicine

## 2013-03-15 ENCOUNTER — Ambulatory Visit (INDEPENDENT_AMBULATORY_CARE_PROVIDER_SITE_OTHER): Payer: Medicare Other | Admitting: Family Medicine

## 2013-03-15 VITALS — BP 160/60 | HR 66 | Temp 97.7°F | Resp 20 | Ht <= 58 in | Wt 132.0 lb

## 2013-03-15 DIAGNOSIS — I5021 Acute systolic (congestive) heart failure: Secondary | ICD-10-CM

## 2013-03-15 DIAGNOSIS — I509 Heart failure, unspecified: Secondary | ICD-10-CM

## 2013-03-15 NOTE — Progress Notes (Signed)
Subjective:    Patient ID: Alison Buckley, female    DOB: Jun 27, 1922, 79 y.o.   MRN: [id number]  HPI  Patient to the hospital December 16 with chest pressure and shortness of breath. This was attributed to pulmonary edema and congestive heart failure. The patient had an elevated BNP at 1700. She was given IV Lasix in the emergency room and diuresed more than 1 L. She then took 40 mg of Lasix for 2 days after hospital visit. After diuresing successfully over those days her shortness of breath is back to her baseline. She denies any further chest pressure. She denies any cough. She denies any dyspnea on exertion. Per the patient, she is back to normal. Her blood pressure is elevated today at 160/60. Past Medical History  Diagnosis Date  . CHF (congestive heart failure)   . Hypertension   . Diabetes mellitus   . Blood transfusion   . Anemia   . High cholesterol   . Peripheral vascular disease   . PAD (peripheral artery disease)   . Ischemic cardiomyopathy   . Pneumonia 04/2009    community acquired pneumonia  . Hypothyroidism   . GIB (gastrointestinal bleeding) 2011 & 04/04/11  . GERD (gastroesophageal reflux disease)   . Arthritis   . Gout   . PAD (peripheral artery disease), stents to L. renal art. and stents to [facility] 04/05/2011  . CAD (coronary artery disease)   . Renal artery stenosis   . Carotid stenosis     Moderate-severe, right  . Dyslipidemia   . Hyperlipidemia 03/08/2013   Past Surgical History  Procedure Laterality Date  . Bladder suspension    . Abdominal hysterectomy  ~ 1953  . Appendectomy  ~ 1953  . Cataract extraction w/ intraocular lens  implant, bilateral  2000's   . Coronary angioplasty with stent placement  1986    "2"  . Coronary angioplasty with stent placement  ~ 1988    "1"  . Coronary artery bypass graft  1996    x3. LIMA to the LAD, SVG to the OM, and SVG to the RCA  . Cardiac catheterization  06/22/2007    Circumflex occluded at origin, RCA proximal  occlusion and had 95% before PDA take off. No intervention, recommended medical therapy.  . Peripheral vascular angiogram  04/18/2008    No intervention - recomend renal angioplasty at later date.  . Peripheral vascular angiogram  04/27/2008    L Renal artery 90% stenosis, stented w/ a 5.5x56mm Herculink stent at 15atm, resulting in reduction of 90% stenosis to 0% residual. L Proximal Popliteal artery stented w/ a 6x59mm EverFlex self-expanding stent. L SFA 89% stenosis, stented w/ a 6x162mm LifeStent self-expanding stent, resulting in reduction of 89% to 0% residual.  . Transthoracic echocardiogram  05/19/2009    EF 50-55%, trivial aortic regurg, mild mitral regurg.  . Cardiovascular stress test  06/28/2010    Significant mis-registration of heaert borders w/ significant liver/bowel uptake leading to artificial dropout counts. Visualized myocardium appears to be free of reversible defects. No persantine EKG changes.  . Carotid doppler  02/14/2012    Bilat CCA-moderate amount of plaque w/o evidence of significant diameter reduction. R Prox ICA-moderate amount suggestive of 70-99% diameter reduction. L Bulb/Proximal ICA-moderate amount w/o evidence of significant diameter reduction. L Vertebral-appeared occluded.   Current Outpatient Prescriptions on File Prior to Visit  Medication Sig Dispense Refill  . allopurinol (ZYLOPRIM) 300 MG tablet Take 300 mg by mouth daily.       Marland Kitchen  amLODipine (NORVASC) 5 MG tablet Take 5 mg by mouth 2 (two) times daily.      Marland Kitchen aspirin EC 81 MG tablet Take 81 mg by mouth daily.       . calcium-vitamin D (OSCAL WITH D) 500-200 MG-UNIT per tablet Take 1 tablet by mouth every evening.      . carvedilol (COREG) 6.25 MG tablet Take 1 tablet (6.25 mg total) by mouth 2 (two) times daily.  180 tablet  3  . furosemide (LASIX) 20 MG tablet Take 20 mg by mouth daily.      . hydrALAZINE (APRESOLINE) 25 MG tablet Take 25 mg by mouth 4 (four) times daily.      . IRON PO Take 1 tablet by  mouth daily.      . isosorbide mononitrate (IMDUR) 60 MG 24 hr tablet Take 60 mg by mouth every morning.      Marland Kitchen levothyroxine (SYNTHROID, LEVOTHROID) 88 MCG tablet Take 88 mcg by mouth daily before breakfast.       . nitroGLYCERIN (NITROSTAT) 0.4 MG SL tablet Place 1 tablet (0.4 mg total) under the tongue every 5 (five) minutes as needed. For chest pain  25 tablet  11  . omeprazole (PRILOSEC) 20 MG capsule Take 20 mg by mouth 2 (two) times daily before a meal.      . ranolazine (RANEXA) 500 MG 12 hr tablet Take 1 tablet (500 mg total) by mouth every morning.  56 tablet  0  . rosuvastatin (CRESTOR) 10 MG tablet Take 10 mg by mouth daily.      . sertraline (ZOLOFT) 50 MG tablet Take 50 mg by mouth daily.       . sitaGLIPtin (JANUVIA) 50 MG tablet Take 50 mg by mouth daily.        No current facility-administered medications on file prior to visit.   Allergies  Allergen Reactions  . Ampicillin Other (See Comments)    unknown  . Levofloxacin     Dizzy    History   Social History  . Marital Status: Widowed    Spouse Name: N/A    Number of Children: N/A  . Years of Education: N/A   Occupational History  . Not on file.   Social History Main Topics  . Smoking status: Former Smoker -- 0.25 packs/day for 10 years    Types: Cigarettes  . Smokeless tobacco: Never Used     Comment: "stopped smoking probably 1980's"  . Alcohol Use: Yes     Comment: 04/04/11 "special occasion maybe 2-3 mixed drinks"  . Drug Use: No  . Sexual Activity: No   Other Topics Concern  . Not on file   Social History Narrative  . No narrative on file     Review of Systems  All other systems reviewed and are negative.       Objective:   Physical Exam  Vitals reviewed. Constitutional: She appears well-developed and well-nourished.  Cardiovascular: Normal rate, regular rhythm and normal heart sounds.   Pulmonary/Chest: Effort normal and breath sounds normal. No respiratory distress. She has no  wheezes. She has no rales.  Abdominal: Soft. Bowel sounds are normal. She exhibits no distension. There is no tenderness. There is no rebound.  Musculoskeletal: She exhibits no edema.          Assessment & Plan:  1. Acute systolic congestive heart failure Patient appears to be euvolemic today I will check her renal function as well as her potassium. At the present time  I do not feel she needs a higher dose of Lasix. She is to continue Lasix 20 mg by mouth daily. I am concerned that her blood pressures elevated. I have check her blood pressure several times over the next 2-3 days. Her blood pressure remains greater than 140/90, I would add losartan 50 mg by mouth daily. The patient will, her blood pressure tomorrow.  She states that her blood pressure at home is ranging 130 to 140/80. - COMPLETE METABOLIC PANEL WITH GFR

## 2013-03-16 ENCOUNTER — Telehealth: Payer: Self-pay | Admitting: Family Medicine

## 2013-03-16 LAB — COMPLETE METABOLIC PANEL WITH GFR
ALT: 13 U/L (ref 0–35)
AST: 23 U/L (ref 0–37)
BUN: 37 mg/dL — ABNORMAL HIGH (ref 6–23)
CO2: 28 mEq/L (ref 19–32)
Calcium: 9 mg/dL (ref 8.4–10.5)
Chloride: 101 mEq/L (ref 96–112)
Creat: 1.25 mg/dL — ABNORMAL HIGH (ref 0.50–1.10)
GFR, Est African American: 44 mL/min — ABNORMAL LOW
GFR, Est Non African American: 38 mL/min — ABNORMAL LOW
Glucose, Bld: 99 mg/dL (ref 70–99)
Total Protein: 6.6 g/dL (ref 6.0–8.3)

## 2013-03-16 NOTE — Telephone Encounter (Signed)
Pt would like a call about blood work and wants to talk to you about her bp Call back number is [phone]

## 2013-03-22 NOTE — Telephone Encounter (Signed)
Pt bp's at home 146/62,143/68,143/62

## 2013-03-23 NOTE — Telephone Encounter (Signed)
bp looks good enough given her age and medical problems.  No change in meds.

## 2013-03-23 NOTE — Telephone Encounter (Signed)
Patients son aware 

## 2013-04-20 ENCOUNTER — Telehealth (HOSPITAL_COMMUNITY): Payer: Self-pay | Admitting: *Deleted

## 2013-04-20 NOTE — Telephone Encounter (Signed)
Forwarded to B. Lassiter, CMA.  

## 2013-04-20 NOTE — Telephone Encounter (Signed)
Pt is due for a Q12 renal doppler per the green sheet order. She had an ov with Dr. Loletha Grayer in December but I dont see any mention of repeating the doppler. Does she still need it?

## 2013-04-22 NOTE — Telephone Encounter (Signed)
Will get an order from Dr. Loletha Grayer if she needs a yearly renal doppler.

## 2013-04-22 NOTE — Telephone Encounter (Signed)
No need for renal doppler

## 2013-04-23 NOTE — Telephone Encounter (Signed)
Per Dr. Loletha Grayer patient does not need yearly renal doppler

## 2013-05-07 ENCOUNTER — Other Ambulatory Visit (HOSPITAL_COMMUNITY): Payer: Self-pay | Admitting: Cardiovascular Disease

## 2013-05-07 DIAGNOSIS — I6529 Occlusion and stenosis of unspecified carotid artery: Secondary | ICD-10-CM

## 2013-05-10 ENCOUNTER — Encounter (HOSPITAL_COMMUNITY): Payer: Medicare Other

## 2013-05-26 ENCOUNTER — Ambulatory Visit (HOSPITAL_COMMUNITY)
Admission: RE | Admit: 2013-05-26 | Discharge: 2013-05-26 | Disposition: A | Discharge status: Home / Self Care | Payer: Medicare Other | Source: Ambulatory Visit | Attending: Cardiovascular Disease | Admitting: Cardiovascular Disease

## 2013-05-26 DIAGNOSIS — I6529 Occlusion and stenosis of unspecified carotid artery: Secondary | ICD-10-CM

## 2013-05-26 NOTE — Progress Notes (Signed)
Carotid Duplex Completed. Anaid Haney, BS, RDMS, RVT  

## 2013-05-27 ENCOUNTER — Telehealth: Payer: Self-pay | Admitting: *Deleted

## 2013-05-27 DIAGNOSIS — I6529 Occlusion and stenosis of unspecified carotid artery: Secondary | ICD-10-CM

## 2013-05-27 NOTE — Telephone Encounter (Signed)
Message copied by Tressa Busman on Thu May 27, 2013  4:31 PM ------      Message from: Sanda Klein      Created: Thu May 27, 2013  2:18 PM       Moderate right carotid stenosis is unchanged. Repeat in one year ------

## 2013-05-27 NOTE — Telephone Encounter (Signed)
Carotid doppler results called to the patient.  Voiced understanding.  Repeat doppler in one year.  Order placed.

## 2013-06-07 ENCOUNTER — Encounter: Payer: Self-pay | Admitting: Family Medicine

## 2013-06-07 ENCOUNTER — Ambulatory Visit (INDEPENDENT_AMBULATORY_CARE_PROVIDER_SITE_OTHER): Payer: Medicare Other | Admitting: Family Medicine

## 2013-06-07 VITALS — BP 140/58 | HR 64 | Temp 97.5°F | Resp 20 | Ht <= 58 in | Wt 127.0 lb

## 2013-06-07 DIAGNOSIS — M25511 Pain in right shoulder: Secondary | ICD-10-CM

## 2013-06-07 DIAGNOSIS — I6529 Occlusion and stenosis of unspecified carotid artery: Secondary | ICD-10-CM

## 2013-06-07 DIAGNOSIS — M25519 Pain in unspecified shoulder: Secondary | ICD-10-CM

## 2013-06-07 NOTE — Progress Notes (Signed)
Subjective:    Patient ID: Alison Buckley, female    DOB: 1922/06/13, 80 y.o.   MRN: [id number]  HPI Patient had several days of pain in her right shoulder. She is unable to abduct the arm greater than 70. It hurts underneath her acriomal process.  It radiates into her deltoid. She denies any neck pain. She denies any cervical radiculopathy. She denies any falls or injuries. She has palpable crepitus in the shoulder joint with range of motion. Past Medical History  Diagnosis Date  . CHF (congestive heart failure)   . Hypertension   . Diabetes mellitus   . Blood transfusion   . Anemia   . High cholesterol   . Peripheral vascular disease   . PAD (peripheral artery disease)   . Ischemic cardiomyopathy   . Pneumonia 04/2009    community acquired pneumonia  . Hypothyroidism   . GIB (gastrointestinal bleeding) 2011 & 04/04/11  . GERD (gastroesophageal reflux disease)   . Arthritis   . Gout   . PAD (peripheral artery disease), stents to L. renal art. and stents to [facility] 04/05/2011  . CAD (coronary artery disease)   . Renal artery stenosis   . Carotid stenosis     Moderate-severe, right  . Dyslipidemia   . Hyperlipidemia 03/08/2013   Current Outpatient Prescriptions on File Prior to Visit  Medication Sig Dispense Refill  . allopurinol (ZYLOPRIM) 300 MG tablet Take 300 mg by mouth daily.       Marland Kitchen amLODipine (NORVASC) 5 MG tablet Take 5 mg by mouth 2 (two) times daily.      Marland Kitchen aspirin EC 81 MG tablet Take 81 mg by mouth daily.       . calcium-vitamin D (OSCAL WITH D) 500-200 MG-UNIT per tablet Take 1 tablet by mouth every evening.      . carvedilol (COREG) 6.25 MG tablet Take 1 tablet (6.25 mg total) by mouth 2 (two) times daily.  180 tablet  3  . furosemide (LASIX) 20 MG tablet Take 20 mg by mouth daily.      . hydrALAZINE (APRESOLINE) 25 MG tablet Take 25 mg by mouth 4 (four) times daily.      . IRON PO Take 1 tablet by mouth daily.      . isosorbide mononitrate (IMDUR) 60 MG 24 hr tablet  Take 60 mg by mouth every morning.      Marland Kitchen levothyroxine (SYNTHROID, LEVOTHROID) 88 MCG tablet Take 88 mcg by mouth daily before breakfast.       . nitroGLYCERIN (NITROSTAT) 0.4 MG SL tablet Place 1 tablet (0.4 mg total) under the tongue every 5 (five) minutes as needed. For chest pain  25 tablet  11  . omeprazole (PRILOSEC) 20 MG capsule Take 20 mg by mouth 2 (two) times daily before a meal.      . ranolazine (RANEXA) 500 MG 12 hr tablet Take 1 tablet (500 mg total) by mouth every morning.  56 tablet  0  . rosuvastatin (CRESTOR) 10 MG tablet Take 10 mg by mouth daily.      . sertraline (ZOLOFT) 50 MG tablet Take 50 mg by mouth daily.       . sitaGLIPtin (JANUVIA) 50 MG tablet Take 50 mg by mouth daily.        No current facility-administered medications on file prior to visit.   Allergies  Allergen Reactions  . Ampicillin Other (See Comments)    unknown  . Levofloxacin     Dizzy  History   Social History  . Marital Status: Widowed    Spouse Name: N/A    Number of Children: N/A  . Years of Education: N/A   Occupational History  . Not on file.   Social History Main Topics  . Smoking status: Former Smoker -- 0.25 packs/day for 10 years    Types: Cigarettes  . Smokeless tobacco: Never Used     Comment: "stopped smoking probably 1980's"  . Alcohol Use: Yes     Comment: 04/04/11 "special occasion maybe 2-3 mixed drinks"  . Drug Use: No  . Sexual Activity: No   Other Topics Concern  . Not on file   Social History Narrative  . No narrative on file      Review of Systems  All other systems reviewed and are negative.       Objective:   Physical Exam  Vitals reviewed. Musculoskeletal:       Right shoulder: She exhibits decreased range of motion, tenderness, bony tenderness, crepitus, pain and decreased strength. She exhibits no spasm and normal pulse.          Assessment & Plan:  1. Right shoulder pain I believe this is a combination of osteoarthritis as well  as subacromial bursitis.  After obtaining informed consent, I injected the right subacromial space with a mixture of 2 cc Marcaine, 2 cc of lidocaine, 2 cc of 40 mg mL Kenalog. The patient is to call me in one week if no better or sooner if worse.

## 2013-06-14 ENCOUNTER — Telehealth: Payer: Self-pay | Admitting: Family Medicine

## 2013-06-14 NOTE — Telephone Encounter (Signed)
Message copied by Alyson Locket on Mon Jun 14, 2013 11:33 AM ------      Message from: Lenore Manner      Created: Mon Jun 14, 2013 11:08 AM      Regarding: f/u      Contact: [phone]       PT is calling to let Dr Dennard Schaumann know that the Shot helped, but still little pain when there is movement but nothing like it was  ------

## 2013-12-03 ENCOUNTER — Other Ambulatory Visit: Payer: Self-pay | Admitting: Cardiovascular Disease

## 2013-12-03 NOTE — Telephone Encounter (Signed)
Rx was sent to pharmacy electronically. 

## 2014-01-23 DEATH — deceased

## 2014-05-23 ENCOUNTER — Telehealth (HOSPITAL_COMMUNITY): Payer: Self-pay | Admitting: *Deleted

## 2015-06-08 ENCOUNTER — Encounter: Payer: Self-pay | Admitting: Gastroenterology
